# Patient Record
Sex: Female | Born: 1935 | Race: White | Hispanic: No | Marital: Married
Health system: Southern US, Community
[De-identification: ages and names within clinical notes are randomized; demographics above are authoritative.]

---

## 2004-09-10 ENCOUNTER — Ambulatory Visit: Payer: Self-pay | Admitting: Ophthalmology

## 2004-09-14 ENCOUNTER — Ambulatory Visit: Payer: Self-pay | Admitting: Ophthalmology

## 2004-10-06 ENCOUNTER — Ambulatory Visit: Payer: Self-pay | Admitting: Internal Medicine

## 2004-10-12 ENCOUNTER — Ambulatory Visit: Payer: Self-pay | Admitting: Ophthalmology

## 2005-03-23 ENCOUNTER — Ambulatory Visit: Payer: Self-pay | Admitting: Internal Medicine

## 2005-04-05 ENCOUNTER — Ambulatory Visit: Payer: Self-pay | Admitting: Internal Medicine

## 2005-07-21 ENCOUNTER — Ambulatory Visit: Payer: Self-pay | Admitting: Unknown Physician Specialty

## 2005-11-10 ENCOUNTER — Ambulatory Visit: Payer: Self-pay | Admitting: General Surgery

## 2006-04-26 ENCOUNTER — Ambulatory Visit: Payer: Self-pay | Admitting: Internal Medicine

## 2006-12-07 ENCOUNTER — Ambulatory Visit: Payer: Self-pay | Admitting: General Surgery

## 2007-07-04 ENCOUNTER — Ambulatory Visit: Payer: Self-pay | Admitting: Family Medicine

## 2008-01-01 ENCOUNTER — Ambulatory Visit: Payer: Self-pay | Admitting: Internal Medicine

## 2008-04-14 ENCOUNTER — Ambulatory Visit: Payer: Self-pay | Admitting: Internal Medicine

## 2008-10-16 ENCOUNTER — Ambulatory Visit: Payer: Self-pay | Admitting: Family Medicine

## 2008-12-11 ENCOUNTER — Ambulatory Visit: Payer: Self-pay | Admitting: Family Medicine

## 2009-01-08 ENCOUNTER — Ambulatory Visit: Payer: Self-pay | Admitting: Family Medicine

## 2009-06-12 ENCOUNTER — Emergency Department: Payer: Self-pay | Admitting: Emergency Medicine

## 2010-01-12 ENCOUNTER — Ambulatory Visit: Payer: Self-pay | Admitting: Family Medicine

## 2010-12-25 ENCOUNTER — Inpatient Hospital Stay: Payer: Self-pay | Admitting: Internal Medicine

## 2010-12-26 ENCOUNTER — Ambulatory Visit: Payer: Self-pay | Admitting: Internal Medicine

## 2010-12-29 LAB — CANCER ANTIGEN 19-9: CA 19-9: 8615 U/mL — ABNORMAL HIGH (ref 0–35)

## 2011-01-15 ENCOUNTER — Inpatient Hospital Stay: Payer: Self-pay | Admitting: Internal Medicine

## 2011-01-26 ENCOUNTER — Ambulatory Visit: Payer: Self-pay | Admitting: Internal Medicine

## 2011-01-27 LAB — CREATININE, SERUM
EGFR (African American): >60
EGFR (Non-African Amer.): >60

## 2011-02-05 ENCOUNTER — Ambulatory Visit: Payer: Self-pay | Admitting: Internal Medicine

## 2011-02-05 LAB — POTASSIUM: Potassium: 2.7 mmol/L — ABNORMAL LOW (ref 3.5–5.1)

## 2011-02-26 ENCOUNTER — Ambulatory Visit: Payer: Self-pay | Admitting: Internal Medicine

## 2011-05-11 ENCOUNTER — Ambulatory Visit: Payer: Self-pay | Admitting: Internal Medicine

## 2011-05-26 ENCOUNTER — Ambulatory Visit: Payer: Self-pay | Admitting: Internal Medicine

## 2011-08-10 ENCOUNTER — Ambulatory Visit: Payer: Self-pay | Admitting: Internal Medicine

## 2011-08-26 ENCOUNTER — Ambulatory Visit: Payer: Self-pay | Admitting: Internal Medicine

## 2011-09-30 ENCOUNTER — Ambulatory Visit: Payer: Self-pay | Admitting: Gastroenterology

## 2011-11-09 ENCOUNTER — Ambulatory Visit: Payer: Self-pay | Admitting: Internal Medicine

## 2011-11-26 ENCOUNTER — Ambulatory Visit: Payer: Self-pay | Admitting: Internal Medicine

## 2011-12-11 ENCOUNTER — Inpatient Hospital Stay: Payer: Self-pay | Admitting: Internal Medicine

## 2011-12-11 LAB — URINALYSIS, COMPLETE
Bilirubin,UR: NEGATIVE
Hyaline Cast: 93
Ketone: NEGATIVE
Leukocyte Esterase: NEGATIVE
Ph: 5 (ref 4.5–8.0)
Specific Gravity: 1.027 (ref 1.003–1.030)
Squamous Epithelial: 2

## 2011-12-11 LAB — CBC
HCT: 39.3 % (ref 35.0–47.0)
MCH: 29.2 pg (ref 26.0–34.0)
MCHC: 34.3 g/dL (ref 32.0–36.0)
MCV: 85 fL (ref 80–100)
Platelet: 116 10*3/uL — ABNORMAL LOW (ref 150–440)
RDW: 15.2 % — ABNORMAL HIGH (ref 11.5–14.5)
WBC: 12 10*3/uL — ABNORMAL HIGH (ref 3.6–11.0)

## 2011-12-11 LAB — TROPONIN I: Troponin-I: <0.02 ng/mL

## 2011-12-11 LAB — COMPREHENSIVE METABOLIC PANEL
Albumin: 3 g/dL — ABNORMAL LOW (ref 3.4–5.0)
Anion Gap: 10 (ref 7–16)
BUN: 24 mg/dL — ABNORMAL HIGH (ref 7–18)
Calcium, Total: 8.6 mg/dL (ref 8.5–10.1)
Chloride: 104 mmol/L (ref 98–107)
Co2: 24 mmol/L (ref 21–32)
EGFR (African American): 29 — ABNORMAL LOW
EGFR (Non-African Amer.): 25 — ABNORMAL LOW
Glucose: 148 mg/dL — ABNORMAL HIGH (ref 65–99)
Osmolality: 282 (ref 275–301)
Potassium: 3.3 mmol/L — ABNORMAL LOW (ref 3.5–5.1)
SGPT (ALT): 20 U/L (ref 12–78)
Sodium: 138 mmol/L (ref 136–145)
Total Protein: 6.9 g/dL (ref 6.4–8.2)

## 2011-12-11 LAB — AMMONIA: Ammonia, Plasma: <25 mcmol/L (ref 11–32)

## 2011-12-11 LAB — CK TOTAL AND CKMB (NOT AT ARMC): CK, Total: 667 U/L — ABNORMAL HIGH (ref 21–215)

## 2011-12-12 LAB — CBC WITH DIFFERENTIAL/PLATELET
Basophil #: 0 10*3/uL (ref 0.0–0.1)
Basophil %: 0.2 %
HCT: 36 % (ref 35.0–47.0)
HGB: 12.4 g/dL (ref 12.0–16.0)
Lymphocyte %: 4.6 %
MCHC: 34.5 g/dL (ref 32.0–36.0)
MCV: 85 fL (ref 80–100)
Monocyte #: 0.2 x10 3/mm (ref 0.2–0.9)
Monocyte %: 4.3 %
Neutrophil #: 5 10*3/uL (ref 1.4–6.5)
WBC: 5.5 10*3/uL (ref 3.6–11.0)

## 2011-12-12 LAB — BASIC METABOLIC PANEL
Anion Gap: 8 (ref 7–16)
BUN: 21 mg/dL — ABNORMAL HIGH (ref 7–18)
Calcium, Total: 8.5 mg/dL (ref 8.5–10.1)
EGFR (African American): 54 — ABNORMAL LOW
EGFR (Non-African Amer.): 47 — ABNORMAL LOW
Glucose: 113 mg/dL — ABNORMAL HIGH (ref 65–99)
Osmolality: 281 (ref 275–301)
Potassium: 3.7 mmol/L (ref 3.5–5.1)

## 2011-12-12 LAB — MAGNESIUM: Magnesium: 1.6 mg/dL — ABNORMAL LOW

## 2011-12-13 LAB — CBC WITH DIFFERENTIAL/PLATELET
Basophil #: 0 10*3/uL (ref 0.0–0.1)
Basophil %: 0.4 %
Eosinophil #: 0.1 10*3/uL (ref 0.0–0.7)
Eosinophil %: 0.9 %
Eosinophil %: 1.1 %
HCT: 32.4 % — ABNORMAL LOW (ref 35.0–47.0)
HCT: 34.7 % — ABNORMAL LOW (ref 35.0–47.0)
HGB: 11.8 g/dL — ABNORMAL LOW (ref 12.0–16.0)
Lymphocyte #: 0.6 10*3/uL — ABNORMAL LOW (ref 1.0–3.6)
Lymphocyte %: 7.5 %
MCH: 29 pg (ref 26.0–34.0)
Monocyte #: 0.3 x10 3/mm (ref 0.2–0.9)
Monocyte %: 7.4 %
Monocyte %: 5.6 %
Neutrophil #: 4.4 10*3/uL (ref 1.4–6.5)
Neutrophil #: 4.9 10*3/uL (ref 1.4–6.5)
Neutrophil %: 84.1 %
Neutrophil %: 82.9 %
Platelet: 41 10*3/uL — ABNORMAL LOW (ref 150–440)
RDW: 15.2 % — ABNORMAL HIGH (ref 11.5–14.5)
RDW: 15.2 % — ABNORMAL HIGH (ref 11.5–14.5)
WBC: 5.2 10*3/uL (ref 3.6–11.0)
WBC: 6 10*3/uL (ref 3.6–11.0)

## 2011-12-13 LAB — PROTIME-INR
INR: 1.1
Prothrombin Time: 14.2 secs (ref 11.5–14.7)

## 2011-12-13 LAB — COMPREHENSIVE METABOLIC PANEL
Albumin: 1.9 g/dL — ABNORMAL LOW (ref 3.4–5.0)
Anion Gap: 7 (ref 7–16)
BUN: 19 mg/dL — ABNORMAL HIGH (ref 7–18)
Calcium, Total: 8 mg/dL — ABNORMAL LOW (ref 8.5–10.1)
Chloride: 109 mmol/L — ABNORMAL HIGH (ref 98–107)
Co2: 21 mmol/L (ref 21–32)
Creatinine: 1.06 mg/dL (ref 0.60–1.30)
EGFR (Non-African Amer.): 50 — ABNORMAL LOW
Osmolality: 276 (ref 275–301)
SGOT(AST): 46 U/L — ABNORMAL HIGH (ref 15–37)
SGPT (ALT): 17 U/L (ref 12–78)
Total Protein: 5.3 g/dL — ABNORMAL LOW (ref 6.4–8.2)

## 2011-12-13 LAB — FIBRIN DEGRADATION PROD.(ARMC ONLY): Fibrin Degradation Prod.: >10 ug/ml — ABNORMAL HIGH (ref 2.1–7.7)

## 2011-12-13 LAB — APTT: Activated PTT: 33.9 secs (ref 23.6–35.9)

## 2011-12-13 LAB — FIBRINOGEN: Fibrinogen: >750 mg/dL — ABNORMAL HIGH (ref 210–470)

## 2011-12-14 LAB — CBC WITH DIFFERENTIAL/PLATELET
Basophil %: 0.1 %
Eosinophil #: 0.1 10*3/uL (ref 0.0–0.7)
HCT: 32.9 % — ABNORMAL LOW (ref 35.0–47.0)
HGB: 11.4 g/dL — ABNORMAL LOW (ref 12.0–16.0)
Lymphocyte #: 0.5 10*3/uL — ABNORMAL LOW (ref 1.0–3.6)
Lymphocyte %: 7.6 %
MCH: 29.1 pg (ref 26.0–34.0)
MCHC: 34.6 g/dL (ref 32.0–36.0)
Monocyte #: 0.5 x10 3/mm (ref 0.2–0.9)
Neutrophil %: 83.7 %
Platelet: 62 10*3/uL — ABNORMAL LOW (ref 150–440)
RDW: 15.1 % — ABNORMAL HIGH (ref 11.5–14.5)

## 2011-12-14 LAB — CULTURE, BLOOD (SINGLE)

## 2011-12-15 LAB — HEPATIC FUNCTION PANEL A (ARMC)
Albumin: 2 g/dL — ABNORMAL LOW (ref 3.4–5.0)
Alkaline Phosphatase: 80 U/L (ref 50–136)
Bilirubin, Direct: 0.2 mg/dL (ref 0.00–0.20)
Bilirubin,Total: 0.7 mg/dL (ref 0.2–1.0)
SGOT(AST): 36 U/L (ref 15–37)
Total Protein: 4.9 g/dL — ABNORMAL LOW (ref 6.4–8.2)

## 2011-12-15 LAB — CBC WITH DIFFERENTIAL/PLATELET
Basophil #: 0 10*3/uL (ref 0.0–0.1)
Eosinophil #: 0.1 10*3/uL (ref 0.0–0.7)
Lymphocyte #: 0.9 10*3/uL — ABNORMAL LOW (ref 1.0–3.6)
MCV: 85 fL (ref 80–100)
Monocyte #: 0.5 x10 3/mm (ref 0.2–0.9)
Monocyte %: 10.2 %
Neutrophil #: 3 10*3/uL (ref 1.4–6.5)
Platelet: 73 10*3/uL — ABNORMAL LOW (ref 150–440)
RDW: 15.4 % — ABNORMAL HIGH (ref 11.5–14.5)
WBC: 4.4 10*3/uL (ref 3.6–11.0)

## 2012-02-02 ENCOUNTER — Ambulatory Visit: Payer: Self-pay | Admitting: Gastroenterology

## 2012-05-05 ENCOUNTER — Emergency Department: Payer: Self-pay | Admitting: Emergency Medicine

## 2012-06-18 IMAGING — US ABDOMEN ULTRASOUND
1 series · 17 of 25 positions shown · non-contrast
Comparison: none

REASON FOR EXAM: abd pain
COMMENTS:

[Series 1: abdomen ultrasound · 17 of 91 slices shown]
[im 1/91]
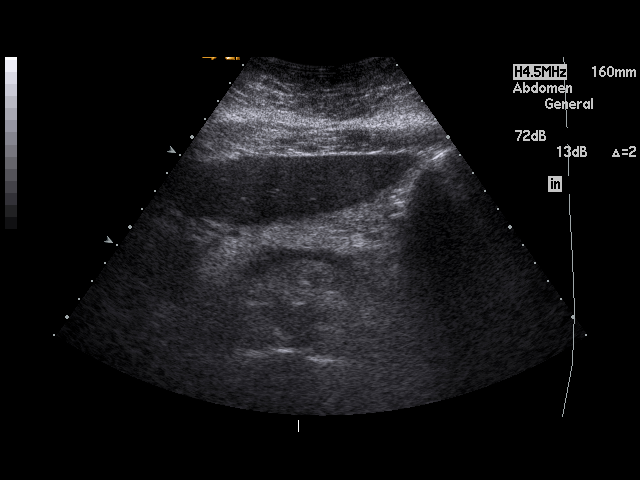
[im 8/91]
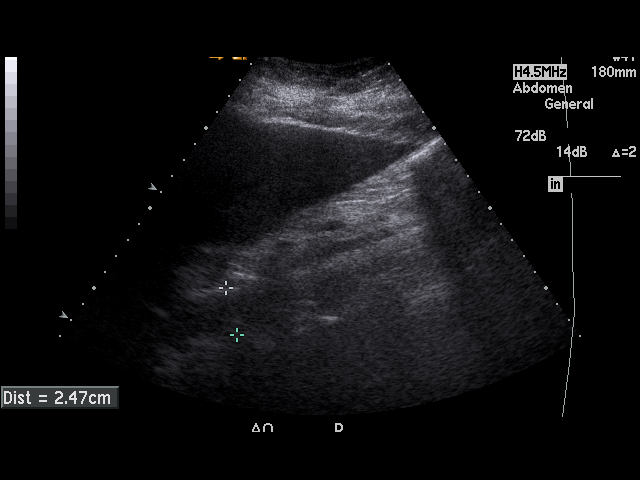
[im 12/91]
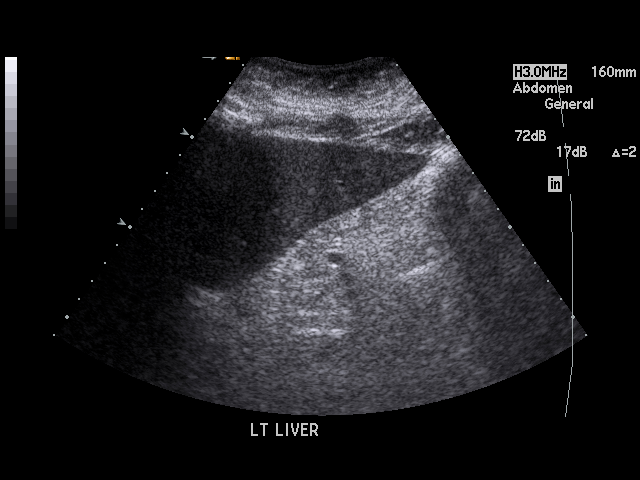
[im 19/91]
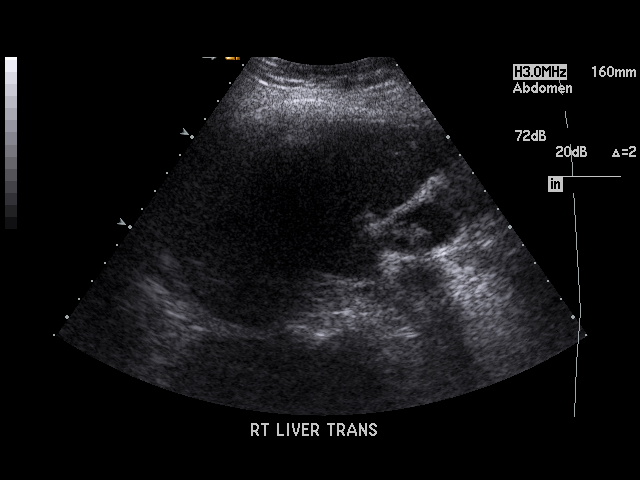
[im 23/91]
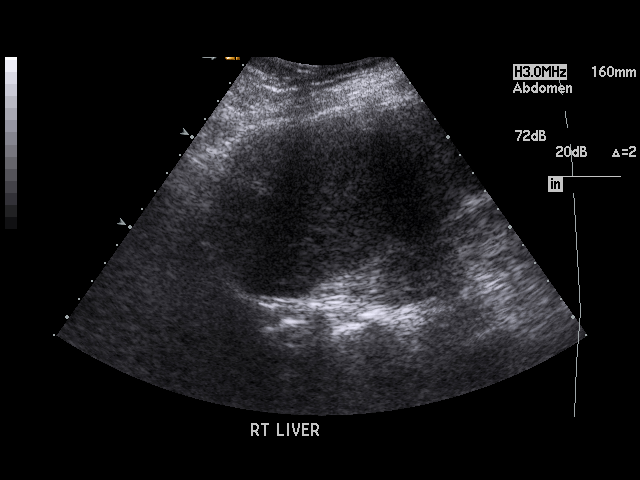
[im 31/91]
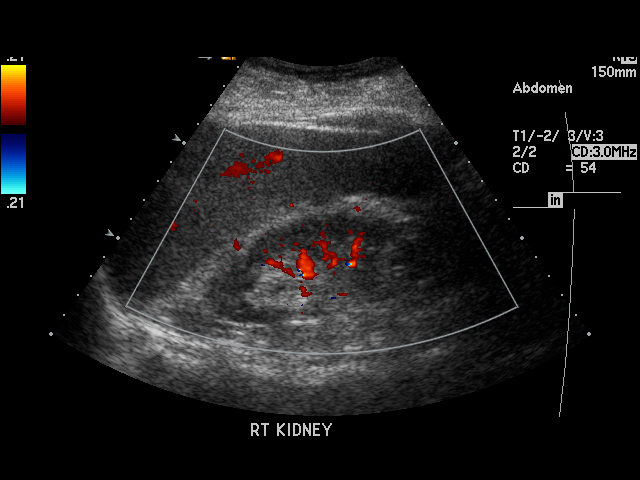
[im 34/91]
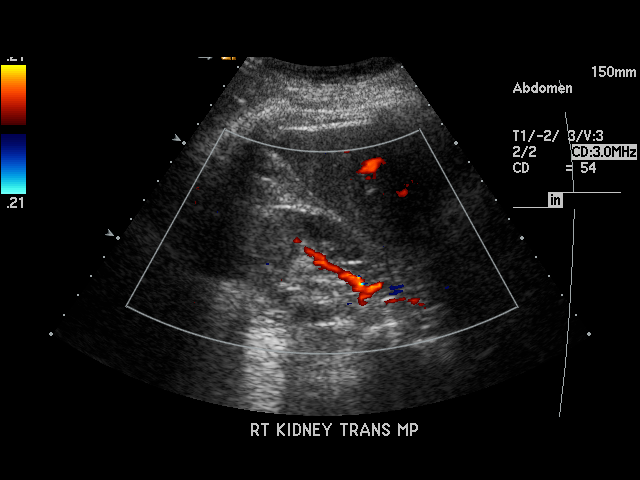
[im 42/91]
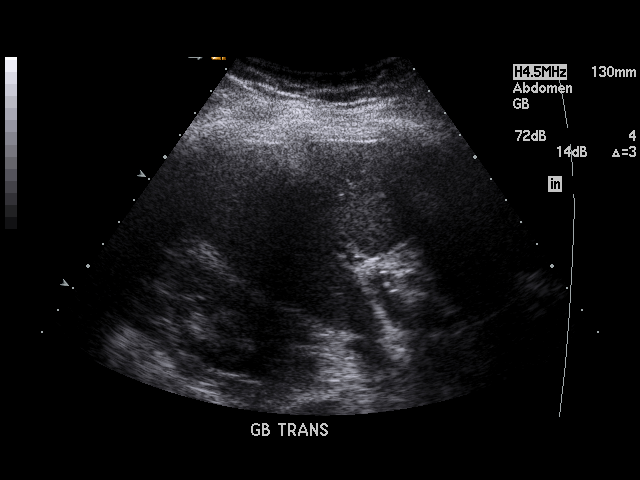
[im 46/91]
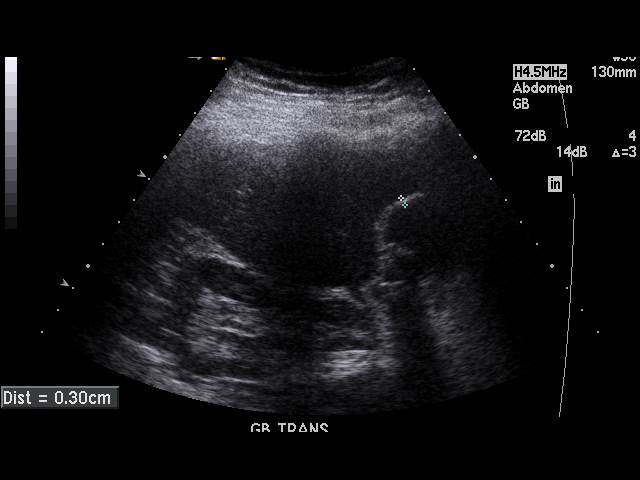
[im 49/91]
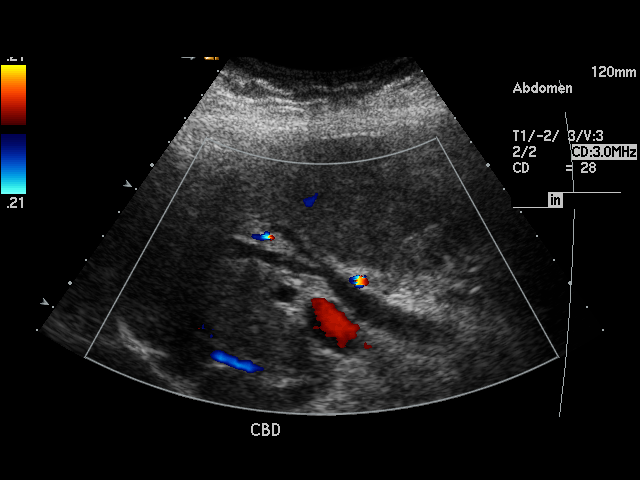
[im 57/91]
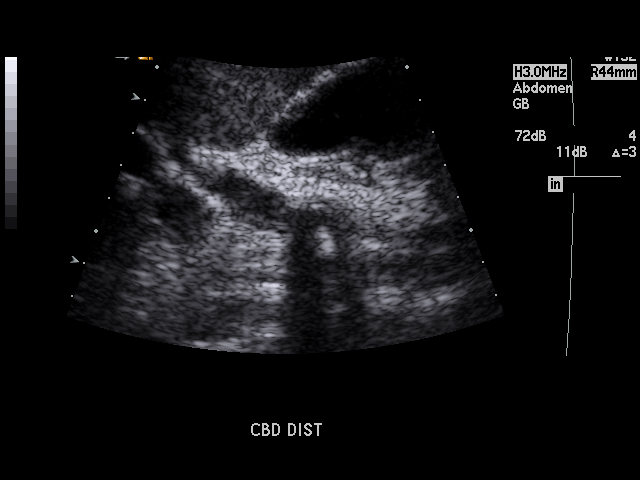
[im 61/91]
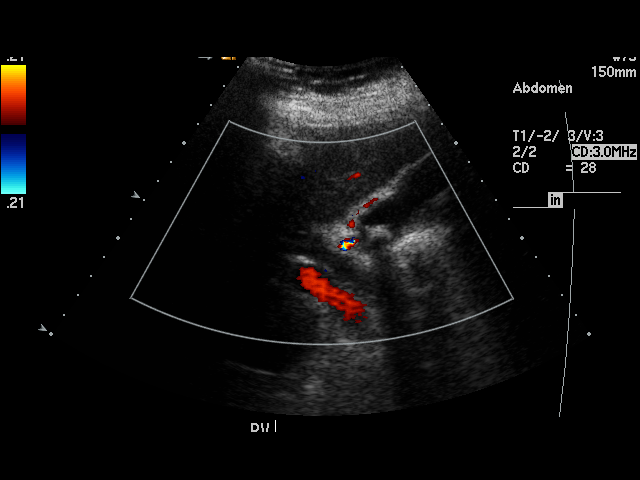
[im 68/91]
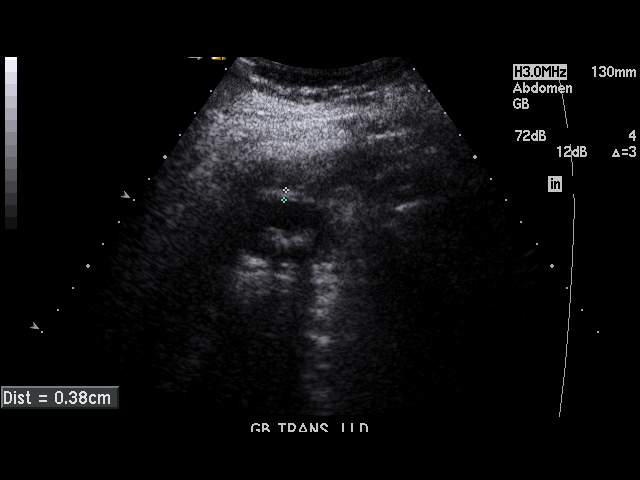
[im 72/91]
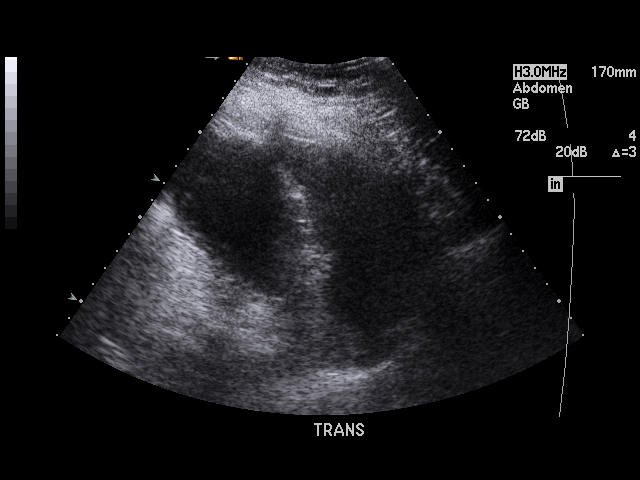
[im 79/91]
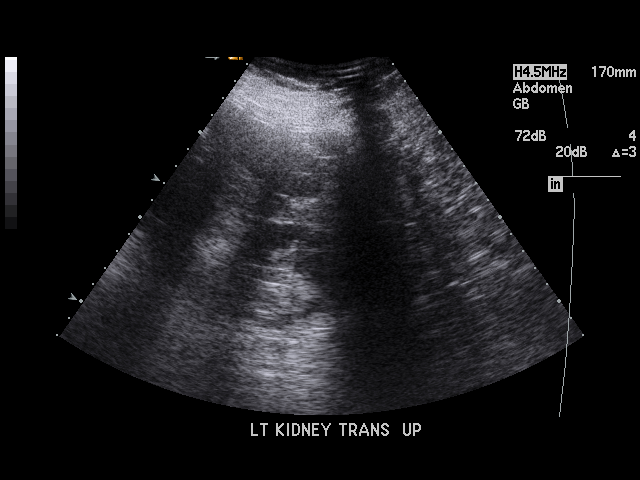
[im 83/91]
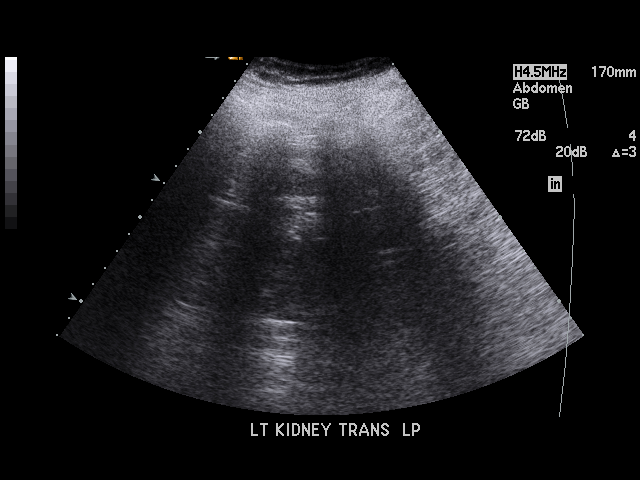
[im 91/91]
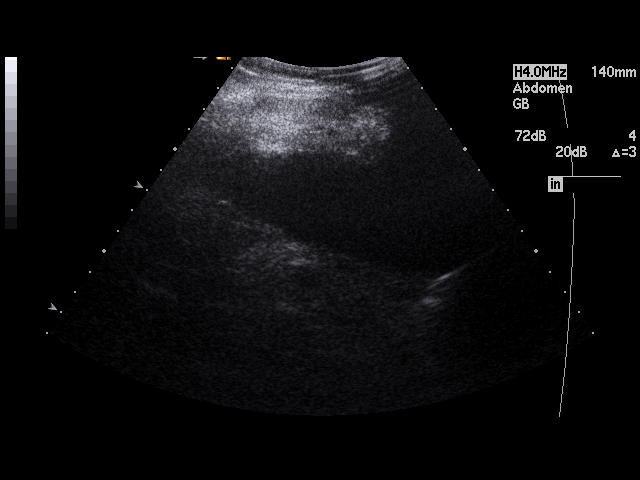

[17 of 25 positions shown; findings below may reference images not displayed]

PROCEDURE:     US  - US ABDOMEN GENERAL SURVEY  - January 15, 2011  [DATE]

RESULT:     The liver, pancreas, abdominal aorta and inferior vena cava show
no significant abnormalities. The spleen measures 13.66 cm at maximum
diameter and is mildly enlarged. There are dilated intrahepatic biliary
ducts. The common bile duct is enlarged and measures 9.8 mm at maximum
diameter. There is observed a shadowing echodensity in the common bile duct
compatible with choledocholithiasis. There are again noted multiple echo
densities in the gallbladder compatible with gallstones. There is thickening
of the gallbladder wall which now measures approximately 4.2 mm at maximum
thickness. No pericholecystic fluid is seen.

The kidneys show no hydronephrosis. No ascites is seen.
IMPRESSION: 1. Cholelithiasis with there being associated thickening of the gallbladder
wall suspicious for cholecystitis.
2. There is dilatation of the common bile duct with dilated intrahepatic
ducts observed.
3. There is a shadowing echodensity visualized within the common duct
consistent with choledocholithiasis.
4. The spleen is mildly enlarged.

## 2012-07-25 ENCOUNTER — Ambulatory Visit: Payer: Self-pay | Admitting: Internal Medicine

## 2012-08-08 ENCOUNTER — Inpatient Hospital Stay: Payer: Self-pay | Admitting: Internal Medicine

## 2012-08-08 LAB — COMPREHENSIVE METABOLIC PANEL
Albumin: 2.9 g/dL — ABNORMAL LOW (ref 3.4–5.0)
BUN: 71 mg/dL — ABNORMAL HIGH (ref 7–18)
Bilirubin,Total: 0.3 mg/dL (ref 0.2–1.0)
Chloride: 103 mmol/L (ref 98–107)
Creatinine: 5.76 mg/dL — ABNORMAL HIGH (ref 0.60–1.30)
EGFR (African American): 7 — ABNORMAL LOW
Glucose: 83 mg/dL (ref 65–99)
Osmolality: 288 (ref 275–301)
SGOT(AST): 17 U/L (ref 15–37)
Sodium: 134 mmol/L — ABNORMAL LOW (ref 136–145)

## 2012-08-08 LAB — CBC
HCT: 30.9 % — ABNORMAL LOW (ref 35.0–47.0)
MCH: 28.4 pg (ref 26.0–34.0)
MCHC: 33.9 g/dL (ref 32.0–36.0)
MCV: 84 fL (ref 80–100)
WBC: 7.4 10*3/uL (ref 3.6–11.0)

## 2012-08-08 LAB — URINALYSIS, COMPLETE
Bilirubin,UR: NEGATIVE
Glucose,UR: NEGATIVE mg/dL (ref 0–75)
Ketone: NEGATIVE
Nitrite: POSITIVE
Ph: 6 (ref 4.5–8.0)
RBC,UR: 5 /HPF (ref 0–5)
Squamous Epithelial: 2
WBC UR: 587 /HPF (ref 0–5)

## 2012-08-08 LAB — TROPONIN I: Troponin-I: <0.02 ng/mL

## 2012-08-08 LAB — VALPROIC ACID LEVEL: Valproic Acid: 45 ug/mL — ABNORMAL LOW

## 2012-08-09 LAB — CBC WITH DIFFERENTIAL/PLATELET
Basophil %: 0.7 %
Eosinophil #: 0.2 10*3/uL (ref 0.0–0.7)
Eosinophil %: 4.1 %
HCT: 27 % — ABNORMAL LOW (ref 35.0–47.0)
HGB: 9.5 g/dL — ABNORMAL LOW (ref 12.0–16.0)
Lymphocyte #: 0.6 10*3/uL — ABNORMAL LOW (ref 1.0–3.6)
MCH: 29.2 pg (ref 26.0–34.0)
MCV: 83 fL (ref 80–100)
Monocyte %: 9.5 %
Neutrophil %: 75.2 %
Platelet: 133 10*3/uL — ABNORMAL LOW (ref 150–440)
RBC: 3.25 10*6/uL — ABNORMAL LOW (ref 3.80–5.20)
RDW: 14 % (ref 11.5–14.5)

## 2012-08-09 LAB — TSH: Thyroid Stimulating Horm: 2.69 u[IU]/mL

## 2012-08-09 LAB — BASIC METABOLIC PANEL
BUN: 61 mg/dL — ABNORMAL HIGH (ref 7–18)
Calcium, Total: 8.2 mg/dL — ABNORMAL LOW (ref 8.5–10.1)
Chloride: 111 mmol/L — ABNORMAL HIGH (ref 98–107)
Co2: 20 mmol/L — ABNORMAL LOW (ref 21–32)
Creatinine: 5.06 mg/dL — ABNORMAL HIGH (ref 0.60–1.30)
EGFR (African American): 9 — ABNORMAL LOW
Glucose: 77 mg/dL (ref 65–99)
Potassium: 2.9 mmol/L — ABNORMAL LOW (ref 3.5–5.1)

## 2012-08-10 LAB — BASIC METABOLIC PANEL
Anion Gap: 12 (ref 7–16)
BUN: 43 mg/dL — ABNORMAL HIGH (ref 7–18)
Co2: 16 mmol/L — ABNORMAL LOW (ref 21–32)
Creatinine: 3.84 mg/dL — ABNORMAL HIGH (ref 0.60–1.30)
EGFR (Non-African Amer.): 10 — ABNORMAL LOW
Osmolality: 298 (ref 275–301)
Potassium: 3.3 mmol/L — ABNORMAL LOW (ref 3.5–5.1)
Sodium: 145 mmol/L (ref 136–145)

## 2012-08-11 LAB — BASIC METABOLIC PANEL
Anion Gap: 10 (ref 7–16)
Calcium, Total: 8.1 mg/dL — ABNORMAL LOW (ref 8.5–10.1)
Co2: 16 mmol/L — ABNORMAL LOW (ref 21–32)
Creatinine: 3.42 mg/dL — ABNORMAL HIGH (ref 0.60–1.30)
Glucose: 81 mg/dL (ref 65–99)
Osmolality: 296 (ref 275–301)
Sodium: 146 mmol/L — ABNORMAL HIGH (ref 136–145)

## 2012-08-11 LAB — URINE CULTURE

## 2012-08-11 LAB — MAGNESIUM: Magnesium: 1.2 mg/dL — ABNORMAL LOW

## 2012-08-12 LAB — BASIC METABOLIC PANEL
BUN: 21 mg/dL — ABNORMAL HIGH (ref 7–18)
Calcium, Total: 8.1 mg/dL — ABNORMAL LOW (ref 8.5–10.1)
Co2: 17 mmol/L — ABNORMAL LOW (ref 21–32)
Creatinine: 2.76 mg/dL — ABNORMAL HIGH (ref 0.60–1.30)
EGFR (African American): 18 — ABNORMAL LOW
EGFR (Non-African Amer.): 16 — ABNORMAL LOW
Glucose: 85 mg/dL (ref 65–99)
Osmolality: 289 (ref 275–301)
Sodium: 144 mmol/L (ref 136–145)

## 2012-08-13 LAB — BASIC METABOLIC PANEL
Anion Gap: 6 — ABNORMAL LOW (ref 7–16)
BUN: 21 mg/dL — ABNORMAL HIGH (ref 7–18)
Calcium, Total: 8.2 mg/dL — ABNORMAL LOW (ref 8.5–10.1)
EGFR (African American): 19 — ABNORMAL LOW
EGFR (Non-African Amer.): 16 — ABNORMAL LOW
Glucose: 83 mg/dL (ref 65–99)

## 2012-08-14 LAB — PROT IMMUNOELECTROPHORES(ARMC)

## 2012-08-14 LAB — BASIC METABOLIC PANEL
Anion Gap: 6 — ABNORMAL LOW (ref 7–16)
Calcium, Total: 8.4 mg/dL — ABNORMAL LOW (ref 8.5–10.1)
Chloride: 115 mmol/L — ABNORMAL HIGH (ref 98–107)
Co2: 21 mmol/L (ref 21–32)
Creatinine: 2.48 mg/dL — ABNORMAL HIGH (ref 0.60–1.30)
EGFR (African American): 21 — ABNORMAL LOW
Sodium: 142 mmol/L (ref 136–145)

## 2012-08-15 LAB — BASIC METABOLIC PANEL
Anion Gap: 6 — ABNORMAL LOW (ref 7–16)
BUN: 22 mg/dL — ABNORMAL HIGH (ref 7–18)
Calcium, Total: 8.8 mg/dL (ref 8.5–10.1)
Chloride: 114 mmol/L — ABNORMAL HIGH (ref 98–107)
Creatinine: 2.61 mg/dL — ABNORMAL HIGH (ref 0.60–1.30)
EGFR (Non-African Amer.): 17 — ABNORMAL LOW
Glucose: 89 mg/dL (ref 65–99)
Osmolality: 286 (ref 275–301)
Sodium: 142 mmol/L (ref 136–145)

## 2012-08-16 LAB — BASIC METABOLIC PANEL
Anion Gap: 5 — ABNORMAL LOW (ref 7–16)
Calcium, Total: 8.6 mg/dL (ref 8.5–10.1)
Chloride: 115 mmol/L — ABNORMAL HIGH (ref 98–107)
Co2: 22 mmol/L (ref 21–32)
EGFR (Non-African Amer.): 15 — ABNORMAL LOW
Osmolality: 287 (ref 275–301)
Potassium: 3.6 mmol/L (ref 3.5–5.1)
Sodium: 142 mmol/L (ref 136–145)

## 2012-08-17 LAB — BASIC METABOLIC PANEL
Anion Gap: 5 — ABNORMAL LOW (ref 7–16)
Calcium, Total: 8.5 mg/dL (ref 8.5–10.1)
Chloride: 116 mmol/L — ABNORMAL HIGH (ref 98–107)
Co2: 22 mmol/L (ref 21–32)
Osmolality: 288 (ref 275–301)
Sodium: 143 mmol/L (ref 136–145)

## 2012-08-17 LAB — PLATELET COUNT: Platelet: 116 10*3/uL — ABNORMAL LOW (ref 150–440)

## 2012-08-25 ENCOUNTER — Ambulatory Visit: Payer: Self-pay | Admitting: Internal Medicine

## 2012-12-27 ENCOUNTER — Emergency Department: Payer: Self-pay

## 2012-12-27 LAB — BASIC METABOLIC PANEL
Anion Gap: 9 (ref 7–16)
BUN: 16 mg/dL (ref 7–18)
Calcium, Total: 9.3 mg/dL (ref 8.5–10.1)
Creatinine: 1.48 mg/dL — ABNORMAL HIGH (ref 0.60–1.30)
EGFR (Non-African Amer.): 33 — ABNORMAL LOW
Osmolality: 278 (ref 275–301)
Sodium: 139 mmol/L (ref 136–145)

## 2012-12-27 LAB — URINALYSIS, COMPLETE
Bilirubin,UR: NEGATIVE
Ph: 5 (ref 4.5–8.0)
Protein: 100
Specific Gravity: 1.016 (ref 1.003–1.030)

## 2012-12-27 LAB — CBC
HGB: 12.7 g/dL (ref 12.0–16.0)
MCH: 29.6 pg (ref 26.0–34.0)
MCHC: 33.5 g/dL (ref 32.0–36.0)
MCV: 88 fL (ref 80–100)
Platelet: 155 10*3/uL (ref 150–440)
RBC: 4.28 10*6/uL (ref 3.80–5.20)

## 2012-12-30 LAB — URINE CULTURE

## 2013-03-11 ENCOUNTER — Emergency Department: Payer: Self-pay | Admitting: Emergency Medicine

## 2013-03-27 ENCOUNTER — Emergency Department: Payer: Self-pay | Admitting: Emergency Medicine

## 2014-01-03 ENCOUNTER — Emergency Department: Payer: Self-pay | Admitting: Emergency Medicine

## 2014-01-10 IMAGING — CT CT HEAD WITHOUT CONTRAST
2 series · 15 of 30 positions shown, 19 images · non-contrast
Comparison: none

REASON FOR EXAM: generalized weakness
COMMENTS:   May transport without cardiac monitor

[Series 2: without · axial · non-contrast · 0.40mm/px · z∈[-74,+46]mm · 13 of 30 slices shown, 17 images]
[im 3/30  brain]
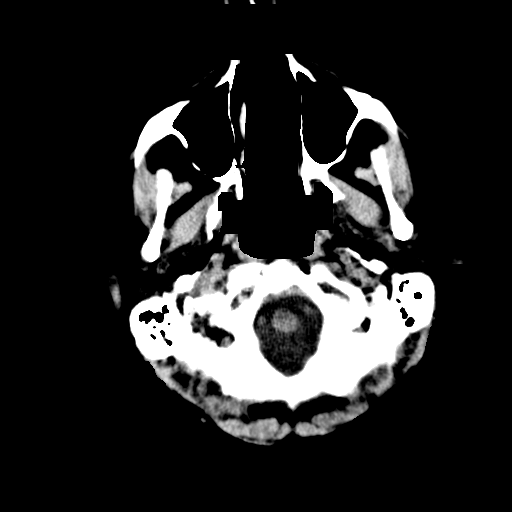
[im 3/30  bone]
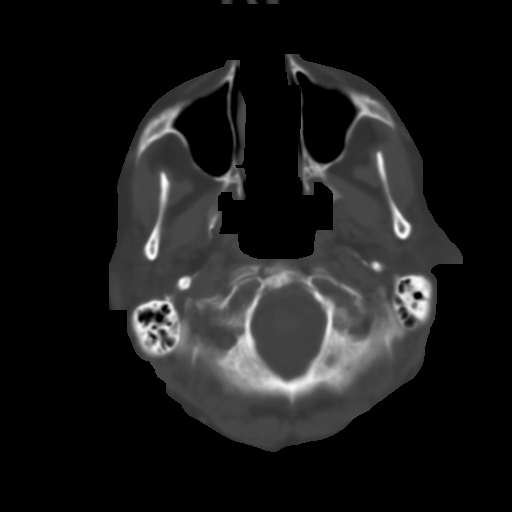
[im 5/30  brain]
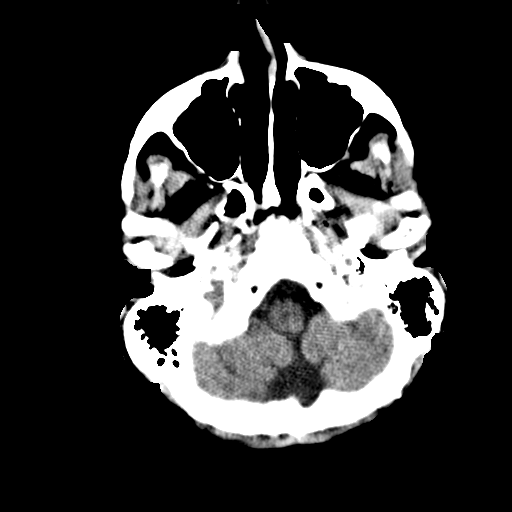
[im 7/30  brain]
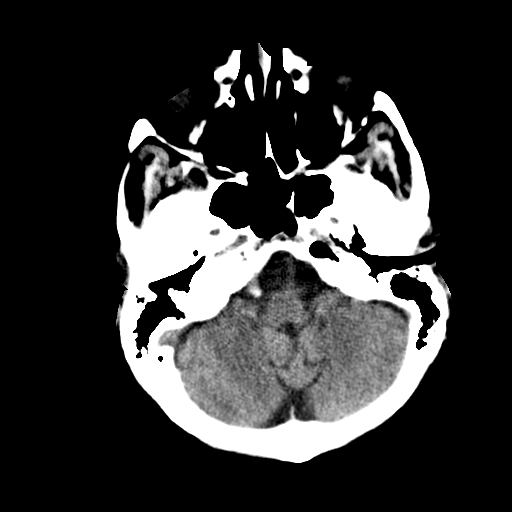
[im 9/30  brain]
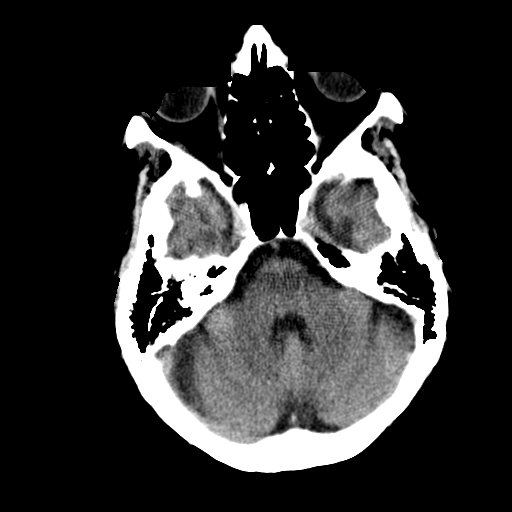
[im 11/30  brain]
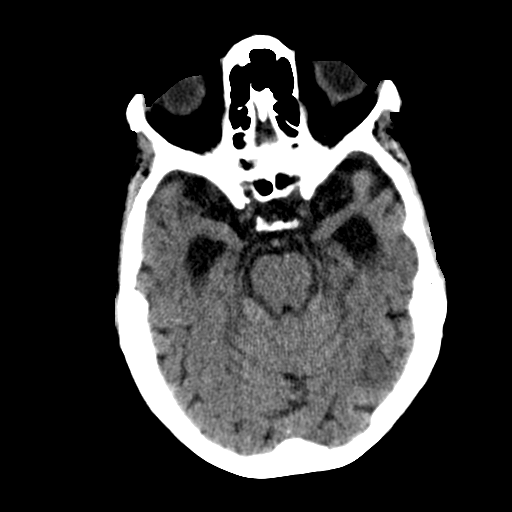
[im 11/30  bone]
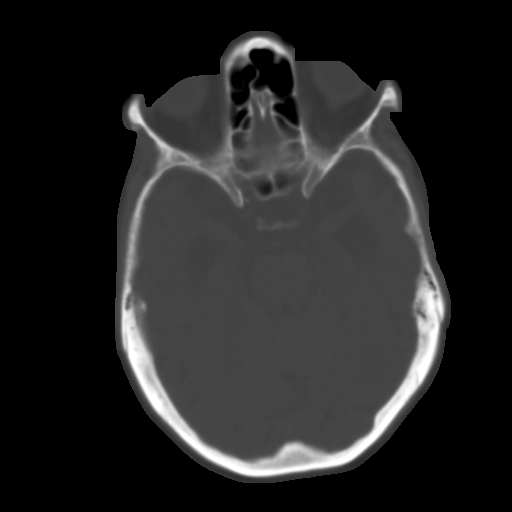
[im 13/30  brain]
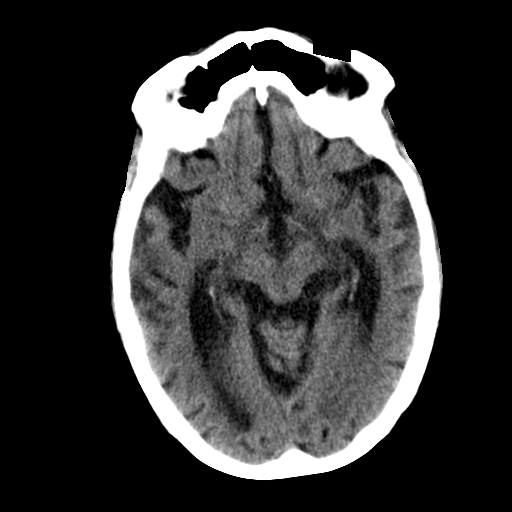
[im 15/30  brain]
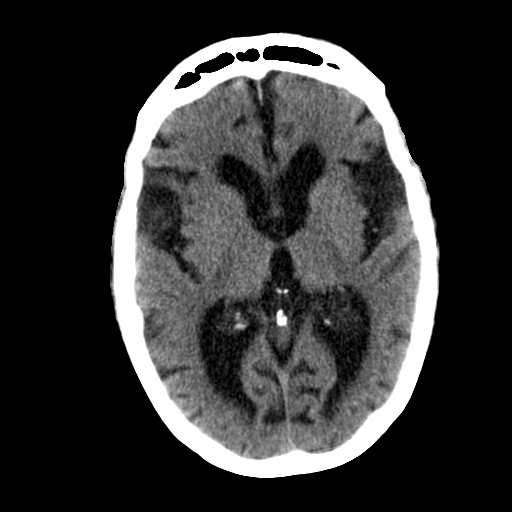
[im 17/30  brain]
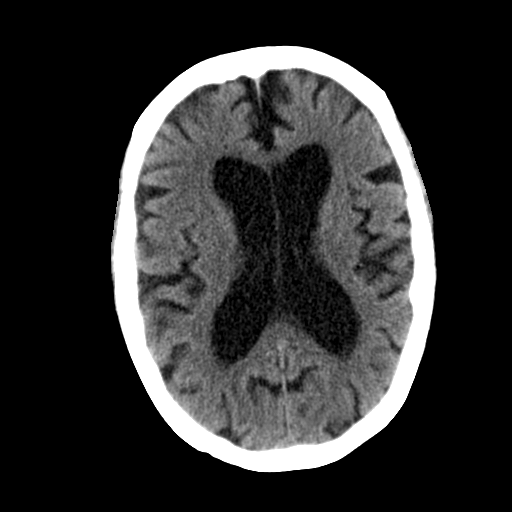
[im 19/30  brain]
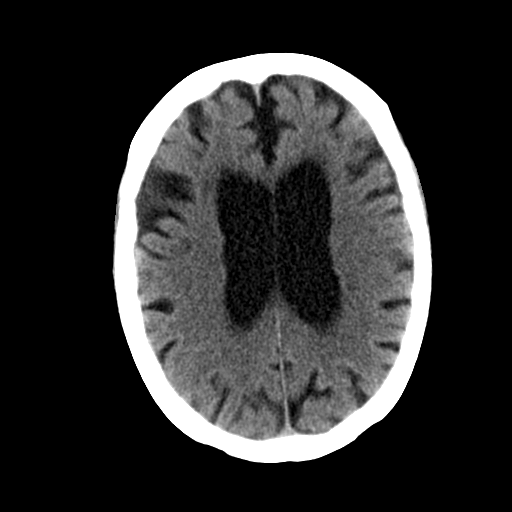
[im 19/30  bone]
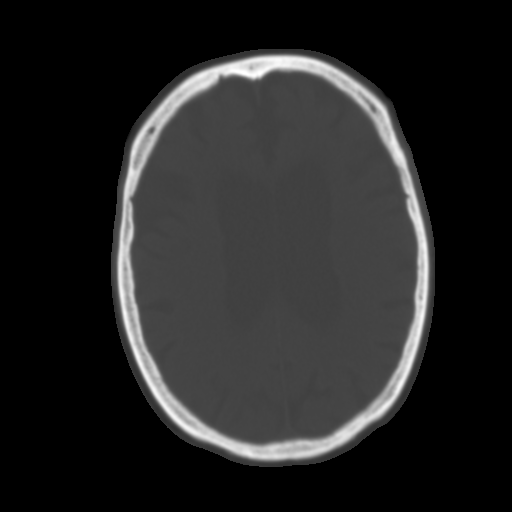
[im 21/30  brain]
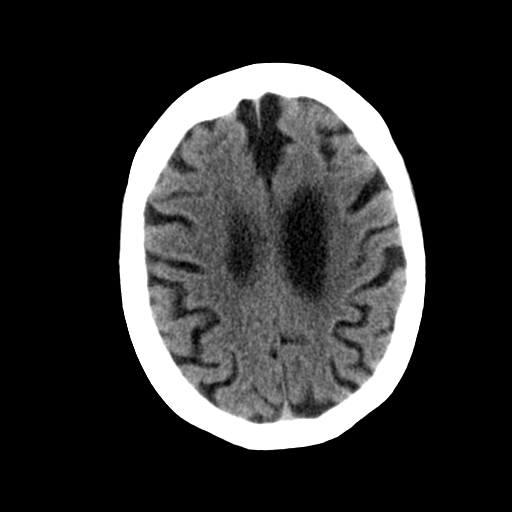
[im 23/30  brain]
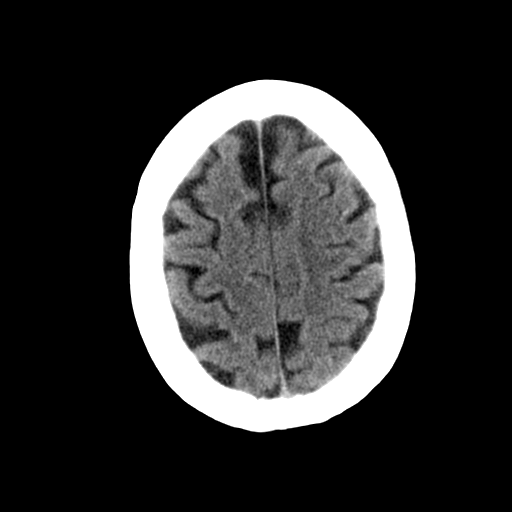
[im 25/30  brain]
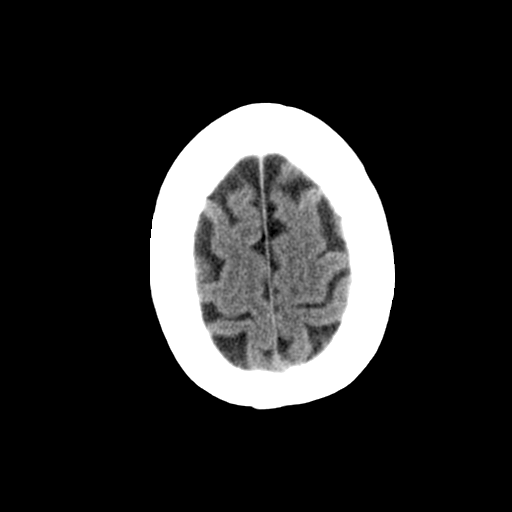
[im 27/30  brain]
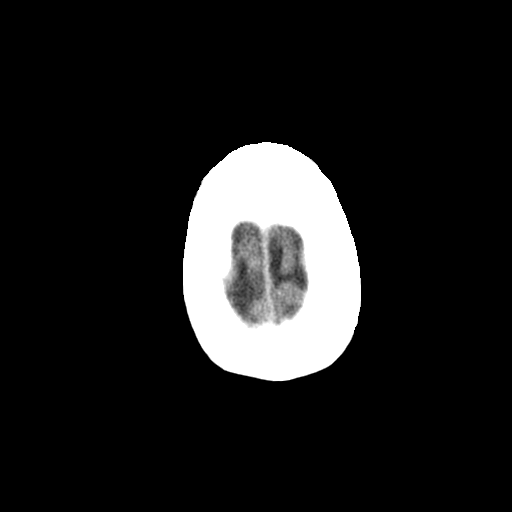
[im 27/30  bone]
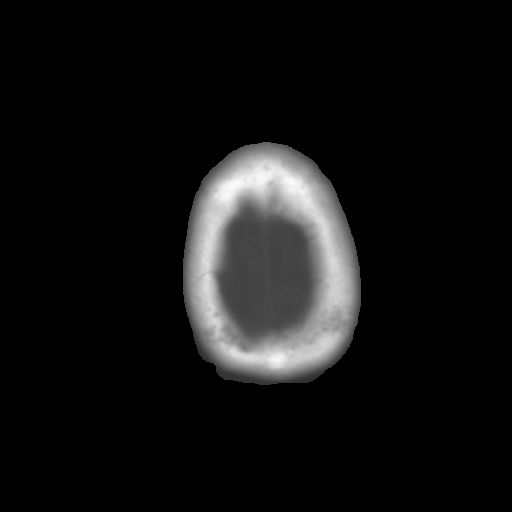

[Series 3: bone · axial · 0.40mm/px · z∈[-74,-54]mm · 2 of 30 slices shown]
[im 3/30  bone]
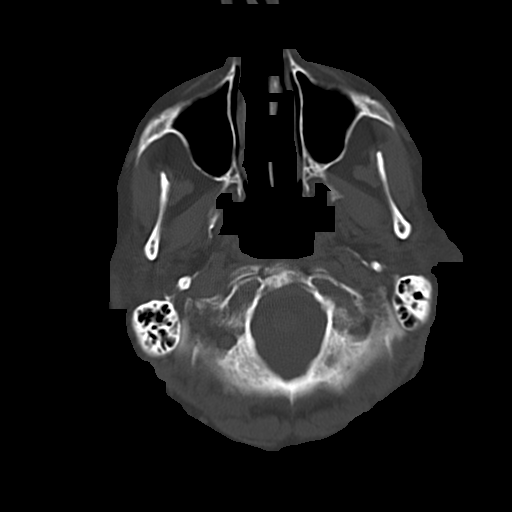
[im 7/30  bone]
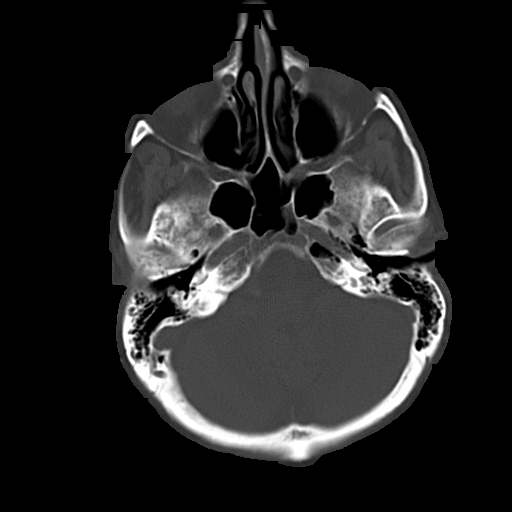

[15 of 30 positions shown; findings below may reference images not displayed]

PROCEDURE:     CT  - CT HEAD WITHOUT CONTRAST  - August 08, 2012  [DATE]

RESULT:     Axial noncontrast CT scanning was performed through the brain
with reconstructions at 5 mm intervals and slice thicknesses. Comparison is
made to the study December 11, 2011.

There is moderate diffuse cerebral and cerebellar atrophy with compensatory
ventriculomegaly. This is not new but is more conspicuous than on the
previous study. There is no shift of the midline. There is no intracranial
hemorrhage. There is no evidence of an evolving ischemic infarction. The
cerebellum and brainstem exhibit no acute abnormalities.

At bone window settings the observed portions of the paranasal sinuses and
mastoid air cells are clear.
IMPRESSION: There is no acute intracranial abnormality. There are
significant chronic changes present as described.

[REDACTED]

## 2014-05-14 NOTE — Consult Note (Signed)
PATIENT NAME:  Terri Becker, Terri Becker MR#:  [id number] DATE OF BIRTH:  October 16, 1930  DATE OF CONSULTATION:  12/15/2011  REFERRING PHYSICIAN:  Alford Highland, MD  CONSULTING PHYSICIAN:  Lurline Del, MD  REASON FOR CONSULTATION: Gram-negative sepsis, history of biliary stricture.   HISTORY OF PRESENT ILLNESS: The patient is a 81 year old female with history of dementia. The patient has a rather interesting history. She was initially seen several months ago due to cholangitis and jaundice. MRCP showed a questionable filling defect in the distal common bile duct. ERCP was performed and sludge was removed but no definite stone was seen. A distal biliary stricture was suspected and patient underwent stent placement. EUS was recommended but that was refused by the patient and her husband. A CA-19-9 came back as 8,000 raising serious concerns about possible cholangiocarcinoma. The patient was seen by Oncology and the patient decided not to proceed any further. She has done well since then. Stent has not been exchanged even though it has been almost a year on the patient's request. She seems to be doing fairly well and due to her advanced age there are risks associated with repeated ERCPs. The patient was admitted by Dr. Renae Gloss three days ago with change in mental status. She had a very poor appetite. No jaundice was reported. No abdominal pain. The patient was found to have Escherichia coli bacteremia. Urinalysis was unremarkable. The patient was evaluated on the request of Dr. Renae Gloss. Denies any abdominal pain. Denies nausea or vomiting although she is demented and history is unreliable.   PAST MEDICAL HISTORY: As above and is also significant for:  1. Dementia.  2. Hypothyroidism.  3. Hypertension. 4. Gastroesophageal reflux disease.   5. Chronic thrombocytopenia.   ALLERGIES: Codeine.   MEDICATIONS:  1. Metoprolol. 2. Prilosec. 3. Remeron. 4. Synthroid.   SOCIAL HISTORY: She used to be a  smoker.   FAMILY HISTORY: Unremarkable.   REVIEW OF SYSTEMS: Negative except for what is mentioned in the history of present illness. Denies any change in color of urine or stool. Denies any abdominal pain, nausea, or vomiting.   PHYSICAL EXAMINATION:   GENERAL: Somewhat lethargic appearing female. She does not appear to be jaundiced. She does not appear to be anemic.   NECK: Neck veins are flat.   LUNGS: Grossly clear to auscultation bilaterally with fair air entry.   CARDIOVASCULAR: Regular rate and rhythm. No gallops or murmur.   ABDOMEN: Did not reveal any significant abdominal tenderness. There is no rebound or guarding. There is no hepatosplenomegaly.   NEUROLOGIC: Except for her being quite lethargic is unremarkable.   LABORATORY, DIAGNOSTIC, AND RADIOLOGICAL DATA: White cell count was 12,000 on admission, came down to 5000 the next day. Hemoglobin is fine. Platelet count chronically low at 66. Liver enzymes were unremarkable and repeat liver enzymes as of today are normal as well. Blood cultures positive for Escherichia coli. Urine culture negative.   CT scan of head without contrast was unremarkable.   ASSESSMENT AND PLAN: The patient is with gram-negative bacteremia. Her white cell count was elevated on admission, although it came down to normal within 24 hours. She did have some mental status changes as well as generalized weakness and lethargy. She was febrile and continued to spike a fever of up to 101.9. Biliary source of sepsis was entertained and possibility of biliary source of sepsis was entertained although patient's liver enzymes remain absolutely normal including bilirubin. The patient has responded very well to intravenous antibiotics. She has been  afebrile. Her white cell count is normal and Dr. Renae GlossWieting has requested discharge on p.o. antibiotics with an outpatient GI follow-up with which I agree. On her follow-up visit we will discuss with her and her husband the need  for further evaluation of her biliary tree as a repeat CA-19-9 is normal on two occasions and no definite mass was noted on CT scan of the abdomen earlier. I agree with discharge on p.o. antibiotics and we will follow her as outpatient and set up an appointment for her to have a repeat ERCP with further evaluation of bile duct.   ____________________________ Lurline DelShaukat Melessia Kaus, MD si:drc D: 12/15/2011 15:56:21 ET T: 12/15/2011 16:21:18 ET JOB#: [id number]  cc: Lurline DelShaukat Aldora Perman, MD, <Dictator> Lurline DelSHAUKAT Alyria Krack MD ELECTRONICALLY SIGNED 12/24/2011 20:03

## 2014-05-14 NOTE — Consult Note (Signed)
Brief Consult Note: Diagnosis: Gram negative sepsis.   Patient was seen by consultant.   Discussed with Attending MD.   Comments: Gram negative bacteremia and sepsis of ? source. Urine cultures negative. Bile duct could potentially be a source due to indwelling stent although patient has no jaundice, abdominal pain or liver enzyme abnormalities. ? History of cholangiovarcinoma based on abnormal ERCP and a CA 19-9 of 8000 although repeat CA19-9 x 2 recently are normal.  Recommendations: IV antibiotics. Further recommendations to follow depending on her hospital course over next several hours. If biliary source of sepsis remains a concern will consider ERCP.  Electronic Signatures: Lurline DelIftikhar, Romana Deaton (MD)  (Signed [id number]-Nov-13 18:15)  Authored: Brief Consult Note   Last Updated: 18-Nov-13 18:15 by Lurline DelIftikhar, Mcclain Shall (MD)

## 2014-05-14 NOTE — H&P (Signed)
PATIENT NAME:  Terri Becker, Terri Becker MR#:  [id number] DATE OF BIRTH:  15-Feb-1930  DATE OF ADMISSION:  12/11/2011  PRIMARY CARE PHYSICIAN:  Dr. Quillian QuinceBliss  CHIEF COMPLAINT: Family with hard time getting her out of bed today.   HISTORY OF PRESENT ILLNESS: This is a 81 year old female with history of dementia. She is not the best historian. History is obtained from husband and family at the bedside. Today they had a hard time getting her out of bed. When the husband went to get something to eat she went to the bathroom and then they could not get her up off the toilet. She did have a bowel movement at that time and urination. No blood. Husband noticed that her speech was slurred. For two days she has not been eating or drinking very well. She did have some nausea and some abdominal pain today.  In the Emergency Room she was found to be in acute renal failure. The ER physician thought there was a urinary tract infection and ordered a urine culture and started antibiotics. She was initially tachycardic and has leukocytosis. Hospitalist services were contacted for admission.   PAST MEDICAL HISTORY:  1. Dementia.  2. Hypothyroidism.  3. Hypertension.  4. Gastroesophageal reflux disease.  5. Chronic thrombocytopenia.  6. Stent in the bile duct.   PAST SURGICAL HISTORY:  Stent in the bile duct and a repeat procedure.   ALLERGIES: Codeine.   MEDICATIONS:  1. Metoprolol 12.5 mg twice a day. 2. Prilosec 20 mg daily.  3. Remeron 15 mg, half tablet at bedtime.  4. Synthroid 75 mg at bedtime.   SOCIAL HISTORY: Lives with husband. Quit smoking 25 years ago. No alcohol. No drug use. 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   FAMILY HISTORY: Father died at 6462 of a myocardial infarction, was an alcoholic.  Mother died at 2794 of old age.  REVIEW OF SYSTEMS:  Difficult secondary to dementia. CONSTITUTIONAL: Positive for weight gain. No fever, chills, or sweats. EYES: She does wear glasses. EARS, NOSE, MOUTH, AND  THROAT: No hearing loss. No sore throat. No difficulty swallowing. CARDIOVASCULAR: No chest pain. No palpitations. RESPIRATORY: No shortness of breath. No cough. No sputum. No hemoptysis. GASTROINTESTINAL: Positive for nausea. No vomiting. Positive for abdominal pain. No diarrhea. No constipation. No bright red blood per rectum. No melena. GENITOURINARY: No burning on urination. No hematuria. MUSCULOSKELETAL: No joint pain or muscle pain. INTEGUMENT: Positive for itching on the back. PSYCHIATRIC: No anxiety or depression. NEUROLOGIC: No fainting or blackouts. ENDOCRINE: Positive for hypothyroidism. HEMATOLOGIC/LYMPHATIC: No anemia.   PHYSICAL EXAMINATION:  VITAL SIGNS: On presentation pulse 101, temperature 98, respirations 20, blood pressure 96/53, pulse oximetry 96% on room air.   GENERAL: No respiratory distress.   EYES: Conjunctivae and lids normal. Pupils equal, round, and reactive to light. Extraocular muscles intact. No nystagmus.   EARS, NOSE, MOUTH, AND THROAT: Tympanic membranes no erythema. Nasal mucosa no erythema. Throat no erythema. No exudate seen. Lips and gums no lesions.   NECK: No JVD. No bruits. No lymphadenopathy. No thyromegaly. No thyroid nodules palpated.   LUNGS: Lungs are clear to auscultation. No use of accessory muscles to breathe.   CARDIOVASCULAR: S1, S2 normal. No gallops, rubs, or murmurs heard. Carotid upstroke 2+ bilaterally. No bruits. Dorsalis pedis pulses 1+ bilaterally. Trace edema of the lower extremities.   ABDOMEN: Soft, slight tenderness in the left upper quadrant. No organomegaly/splenomegaly. Normoactive bowel sounds. No masses felt.   LYMPHATIC: No lymph nodes in the  neck.   MUSCULOSKELETAL: Trace edema. No clubbing. No cyanosis.   SKIN: No ulcers or lesions seen.   NEUROLOGIC: The patient follows simple commands. Cranial nerves II through XII grossly intact. The patient is moving all extremities. Able to straight leg raise bilaterally.    PSYCHIATRIC: The patient is a poor historian, alert to person and place.   LABORATORY, DIAGNOSTIC, AND RADIOLOGIC DATA: CT scan of the head showed no acute abnormality. Urinalysis showed 1+ blood, 1+ bacteria. Nitrites and leukocyte esterase are negative.   Chest x-ray:  No acute abnormality. White blood cell count 12, hemoglobin and hematocrit 13.5 and 39.3, platelet count 116, glucose 148, BUN 24, creatinine 1.87, sodium 138, potassium 3.3, chloride 104, CO2 24, calcium 8.6, total bilirubin 1.6. Other liver function tests: AST slightly elevated at 38, total protein 6.9. Albumin low at 3. CPK 667. Troponin negative. Ammonia less than 25. EKG shows sinus tachycardia at 101 beats per minute.   ASSESSMENT AND PLAN:  1. Acute renal failure with dehydration: We will give IV fluid hydration with normal saline. Continue to monitor creatinine on a daily basis.  2. Systemic inflammatory response syndrome with relative hypotension: Possibility of urinary tract infection. Blood cultures and urine cultures sent. Empiric IV Rocephin given. Hydrate with normal saline. Hold metoprolol at this time. Continue to monitor closely.  3. Dementia: Not on any medication for this. Mental status may get worse while here in the hospital. This was explained to the family.  4. Hypothyroidism: Continue levothyroxine.  5. Gastroesophageal reflux disease: Continue omeprazole.  6. Hypokalemia: Will replace orally and check a magnesium in the a.m.  7. Chronic thrombocytopenia: Platelet count is on the lower side.    CODE STATUS:  The patient is a DO NOT RESUSCITATE.    TIME SPENT ON ADMISSION: 55 minutes.     ____________________________ Herschell Dimes. Renae Gloss, MD rjw:bjt D: 12/11/2011 12:05:25 ET T: 12/11/2011 13:08:26 ET JOB#: [id number]  cc: Herschell Dimes. Renae Gloss, MD, <Dictator> Burley Saver, MD Salley Scarlet MD ELECTRONICALLY SIGNED 12/27/2011 17:28

## 2014-05-14 NOTE — Discharge Summary (Signed)
PATIENT NAME:  Terri Becker, BAUMBACH MR#:  [id number] DATE OF BIRTH:  12-Dec-1930  DATE OF ADMISSION:  12/11/2011 DATE OF DISCHARGE:  12/15/2011  PRIMARY CARE PHYSICIAN: Dr. Quillian Quince   FINAL DIAGNOSES:  1. Sepsis with Escherichia coli.  2. Acute renal failure, which improved.  3. Thrombocytopenia secondary to sepsis.  4. Dementia.  5. Hypothyroidism.  6. Gastroesophageal reflux disease.  7. Hypokalemia.  8. Hypomagnesemia.   MEDICATIONS ON DISCHARGE:  1. Prilosec 20 mg daily.  2. Synthroid 75 mcg daily.  3. Metoprolol 25 mg half-tablet twice a day.  4. Remeron 15 mg half-tablet at night.  5. Cephalexin 500 mg every eight hours until completion. A total of two week course of antibiotics given.  REFERRAL: Home health physical therapy    DIET: Low sodium diet, regular consistency.   ACTIVITY: Activity as tolerated.   FOLLOW-UP:  1. Follow-up with Dr. Niel Hummer of GI in a few weeks. 2. Follow-up with Dr. Quillian Quince in 1 to 2 weeks.   REASON FOR ADMISSION: The patient was admitted 12/11/2011 and discharged 12/15/2011. The reason why she came in is family had a hard time getting her out of bed.   HISTORY OF PRESENT ILLNESS: The patient is a 81 year old female with dementia, not the best historian. They had a hard time getting her out of bed, not eating. Her speech was a little slurred. ER physician thought there was a urinary tract infection, ordered a urine culture and started antibiotics. The patient was initially tachycardic and had leukocytosis. The patient was admitted with acute renal failure and dehydration, systemic inflammatory response syndrome with relative hypotension. Cultures were sent off and the patient was started initially on IV Rocephin.   LABORATORY AND RADIOLOGICAL DATA DURING THE HOSPITAL COURSE: Ammonia level less than 25. Troponin was negative. CPK 667. CK-MB 4.9. Glucose 148, BUN 24, creatinine 1.87, sodium 138, potassium 3.3, chloride 104, CO2 24, calcium 8.6. Liver  function tests total bilirubin 1.6, alkaline phosphatase 131, ALT 20, AST 38, albumin 3.0. White blood cell count 12.0, hemoglobin and hematocrit 13.5 and 39.6, platelet count 116.   EKG showed sinus tachycardia at 101 beats per minute, no acute ST-T wave changes.   Chest x-ray showed no acute abnormality.   Urinalysis showed 1+ bacteria but nitrites and leukocyte esterase were negative, 1+ blood. Urine culture was actually negative.   CT scan of the head showed no acute abnormality.   Blood cultures in all four bottles grew out Escherichia coli, resistant to Cipro and Levaquin but sensitive to everything else. Magnesium on the 17th came back at 1.6, creatinine down to 1.12. Platelet count on the 17th dipped down to 66. On the 18th platelet count dropped down to 41. Fibrinogen greater than 750. INR 1.1. D-dimer greater than 6. Fibrin degradation products greater than 10, less than 40. Platelet count recovered up to 73 upon discharge. White count upon discharge 4.4. Hemoglobin 11.1. Liver function tests normal range. Albumin dipped down to 2.   CONSULTANTS DURING THE HOSPITAL COURSE:  1. Physical therapy  2. Dr. Niel Hummer, Gastroenterology   3. Case was also discussed with Dr. Lorre Nick of Hematology but he did not see the patient in consultation secondary to platelet recovery     HOSPITAL COURSE PER PROBLEM LIST:  1. For the patient's sepsis with Escherichia coli, initially the patient was started on IV Rocephin. When there was some question about the sensitivities of the Escherichia coli, the patient was switched to ertapenem. Upon discharge on the 20th I  did confirm with microbiology this is not ESBL. It is sensitive to the cephalosporins. The patient was given a dose of ertapenem on the 20th and switched over to Keflex upon discharge. The patient will complete a total of 14 day course for the sepsis with Escherichia coli. Not sure where the source of this was. Initially it was thought to be the  urine but the urine culture actually grew out negative. I did speak with Dr. Niel HummerIftikhar because he did put a stent in the biliary tree which could be the source of this. Since the liver function tests were negative, he believed that this may not be the source but he will evaluate as outpatient to determine whether or not the stent needs to be taken out. He was hesitant on doing so with the platelet count being borderline low. The patient never really had any symptoms during the hospital course and never complained of anything. She did have a fever on the 19th of 101.7 but has been afebrile since. The patient was discharged home in stable condition.  2. Acute renal failure. This had improved with IV fluid hydration. The patient was given IV fluid hydration during the hospital course. Last creatinine was a creatinine of 1.06 on the 18th.   3. For the patient's thrombocytopenia, platelet count was low side when she came in at 116 and dropped down as low as 44 but did recover up to 73 upon discharge. I did speak with Dr. Lorre NickGittin as a curbside and he thinks that it was secondary to the sepsis which I believe that is the case also. I think the platelet count will continue to recover as outpatient. I do recommend checking a platelet count prior to any procedures being done once recovery is finished and antibiotics are finished.  4. Dementia. She is not on any medications for this.  5. Hypothyroidism. She was kept on her Synthroid.  6. Gastroesophageal reflux disease. She is on Prilosec.  7. Hypokalemia. The patient was replaced potassium during the hospital course. Since she is eating better, I did not feel the need to replace potassium as outpatient.  8. Hypomagnesemia. This was replaced during the hospital course. Since she is eating better, I did not feel the need to replace this as outpatient.   TIME SPENT ON DISCHARGE: 35 minutes.        CODE STATUS: The patient is a DO NOT RESUSCITATE.    ____________________________ Herschell Dimesichard J. Renae GlossWieting, MD rjw:drc D: 12/15/2011 16:02:17 ET T: 12/16/2011 12:01:51 ET JOB#: [id number]  cc: Herschell Dimesichard J. Renae GlossWieting, MD, <Dictator> Burley SaverL. Katherine Bliss, MD Lurline DelShaukat Iftikhar, MD Salley ScarletICHARD J Tresia Revolorio MD ELECTRONICALLY SIGNED 12/27/2011 17:29

## 2014-05-14 NOTE — Consult Note (Signed)
Chief Complaint:   Subjective/Chief Complaint Feels OK but had fever last night. Will repeat LFT's in am. Ifcontinues to be febrile will consider ERCP with stent remove/ replacement as biliary tree could be the source of bactremia. Will discuss with her husband in am.   Electronic Signatures: Lurline DelIftikhar, Quinn Bartling (MD)  (Signed [id number]-Nov-13 17:50)  Authored: Chief Complaint   Last Updated: 19-Nov-13 17:50 by Lurline DelIftikhar, Jefrey Raburn (MD)

## 2014-05-17 NOTE — Op Note (Signed)
PATIENT NAME:  Terri Becker, Terri Becker MR#:  [id number] DATE OF BIRTH:  [birth year]  DATE OF PROCEDURE:  08/11/2012  PREOPERATIVE DIAGNOSIS:  ESBL E. coli urinary tract infection.  POSTOPERATIVE DIAGNOSIS:  ESBL E. coli urinary tract infection.  PROCEDURES:  1. Ultrasound guidance for vascular access to right basilic vein.  2. Fluoroscopic guidance for placement of catheter.  3. Single lumen PICC line, right arm.  SURGEON:  Levora DredgeGregory Schnier, M.D.  ANESTHESIA: Local.   ESTIMATED BLOOD LOSS: Minimal.   INDICATION FOR PROCEDURE:  Requiring IV antibiotics greater than 5 days.   DESCRIPTION OF PROCEDURE: The patient's right arm was sterilely prepped and draped, and a sterile surgical field was created. The basilic vein was accessed under direct ultrasound guidance without difficulty with a micropuncture needle and permanent image was recorded. 0.018 wire was then placed into the superior vena cava. Peel-away sheath was placed over the wire. A single lumen peripherally inserted central venous catheter was then placed over the wire and the wire and peel-away sheath were removed. The catheter tip was placed into the superior vena cava and was secured at the skin at 38 cm with a sterile dressing. The catheter withdrew blood well and flushed easily with heparinized saline. The patient tolerated procedure well.   ____________________________ Renford DillsGregory G. Schnier, MD ggs:ea D: 08/11/2012 17:47:11 ET T: 08/12/2012 06:12:23 ET JOB#: [id number]  cc: Renford DillsGregory G. Schnier, MD, <Dictator> Renford DillsGREGORY G SCHNIER MD ELECTRONICALLY SIGNED 08/12/2012 11:18

## 2014-05-17 NOTE — H&P (Signed)
PATIENT NAME:  ZHANE, DONLAN MR#:  [id number] DATE OF BIRTH:  [birth year]  DATE OF ADMISSION:  08/08/2012  PRIMARY CARE PHYSICIAN:  Dr. Clemmie Krill  CHIEF COMPLAINT: Brought in secondary to vomiting.   HISTORY OF PRESENT ILLNESS: This is an 81 year old female who resides at [facility]. The patient is unable to give any history secondary to dementia. A son at the bedside able to give history. She had a rash on her legs on 07/04.  They have been using some Benadryl. She also had a urinary tract infection and was given doxycycline, but the patient vomited with the doxycycline and was switched over to [place]. Today, the patient vomited up lunch and became less responsive and was brought in to the ER for further evaluation and creatinine was found to be 5.76, and BUN 71 with a GFR of 6. Urinalysis also positive for leukocyte esterase of 3+ and 2+ blood.  Hospitalist services were contacted for further evaluation.   PAST MEDICAL HISTORY: Dementia, hypothyroidism, hypertension, gastroesophageal reflux disease, chronic thrombocytopenia, stent in the bile duct that was placed and then removed.   PAST SURGICAL HISTORY: Stent in the bile duct.   ALLERGIES: CODEINE.   MEDICATIONS: Include aspirin 81 mg daily, Benadryl 12.5 mg as needed for rash or itching, Depakote 125 mg 2 capsules twice a day, Allegra 180 mg daily, levothyroxine 75 mcg daily MAPAP 500 mg every 4 hours as needed for pain or fever, metoprolol tartrate 25 mg 1/2 tablet twice a day, omeprazole 20 mg daily.   SOCIAL HISTORY: Currently at [facility]. Quit smoking many years ago. No alcohol. No drug use. Worked as a bookkeeper in the past.   FAMILY HISTORY: Father died at 40 of a myocardial infarction. He also was an alcoholic. Mother died at 6 of old age.   REVIEW OF SYSTEMS: Unable to obtain secondary to dementia. Not reliable answers with the patient.   PHYSICAL EXAMINATION:  VITAL SIGNS: Temperature 97.4,  pulse 70, respirations 18, blood pressure 116/58, pulse oximetry 98% on room air.   GENERAL: No respiratory distress.   EYES: Conjunctivae and lids normal. Pupils equal, round and reactive to light. Difficult to test extraocular muscles because she is not following that command as I would like, but she does track me left and right and able to move her extraocular muscles left and right.   EARS, NOSE, MOUTH, AND THROAT: Tympanic membranes tight and blocked by wax. Nasal mucosa: No erythema.  THROAT: No erythema, no exudate seen. Lips and gums: No lesions.   NECK: No JVD. No bruits. No lymphadenopathy. No thyromegaly. No thyroid nodules palpated.   LUNGS: Clear to auscultation. No use of accessory muscles to breathe. No rhonchi, rales or wheeze heard.   CARDIOVASCULAR: S1, S2 normal. No gallops, rubs or murmurs heard. Carotid upstroke 2+ bilaterally. No bruits.   EXTREMITIES: Dorsalis pedis pulses 2+ bilaterally. Trace edema of the lower extremity.   ABDOMEN: Soft, nontender. No organomegaly/splenomegaly. Normoactive bowel sounds. No masses felt.   LYMPHATIC: No lymph nodes in the neck.   MUSCULOSKELETAL: No clubbing, edema or cyanosis.   SKIN:  Positive for petechial rash on the feet bilaterally.  On the legs, it seemed to have disappeared as per the family,   NEUROLOGIC: Cranial nerves II through XII grossly intact. Deep tendon reflexes 1/2+  bilateral lower extremity. The patient is able to straight leg raise bilaterally.   PSYCHIATRIC: The patient is alert, does answer questions, yes or no, but  unreliable with the answers. Follow some commands but not others.    LABORATORY AND RADIOLOGICAL DATA: Ammonia level less than 25. CT scan of the head: No acute intracranial abnormality. White blood cell count 7.4, hemoglobin and hematocrit 10.5 and 30.9, platelet count 163. Glucose 83, BUN 71, creatinine 5.76, sodium 134, potassium 3.5, chloride 103, CO2 21, calcium 8.6. Liver function tests are  normal range. Albumin low at 2.9. GFR 6. Troponin negative. Urinalysis 2+ blood, 3+ leukocyte esterase. Valproic acid is 45.   ASSESSMENT AND PLAN: 1. Acute renal failure. The patient was recently given doxycycline and then Macrobid. I am wondering if that is contributing. The patient did have nausea and vomiting, which could be uremic symptoms since the GFR is 6. I will give IV fluid hydration and continue to monitor her on a daily basis. With the creatinine. I will get a bladder scan and a sonogram of the kidneys and bladder and continue to monitor. With her dementia, I am hesitant on placing a Foley catheter I do not need to.  2. A petechial rash on bilateral feet. I am wondering if this is leukoclastic vasculitis. I will send off an ESR, ANA, ANCA panel, cryoglobulin, hepatitis B and C profile, protein electrophoresis and a rheumatoid factor. If this workup is negative, can consider a dermatology appointment for biopsy, but since the rash on the legs seemed to have disappeared and just on the feet, will continue to monitor at this point.  3. Urinary tract infection. Urine culture was sent off. We will start on IV Rocephin.  4. Acute encephalopathy with underlying dementia likely secondary to the acute renal failure and uremia and also urinary tract infection. We will continue to monitor.  5. Hypothyroidism. Check a TSH in the morning. Continue levothyroxine.  6. Hypertension. Continue low-dose metoprolol. Continue to monitor.  7. Gastroesophageal reflux disease on omeprazole.  TIME SPENT ON ADMISSION: 55 minutes.   CODE STATUS: The patient is a DO NOT RESUSCITATE.    ____________________________ Tana Conch. Leslye Peer, MD rjw:rw D: 08/08/2012 17:35:13 ET T: 08/08/2012 18:21:09 ET JOB#: [id number]  cc: Tana Conch. Leslye Peer, MD, <Dictator> Valetta Close, MD Marisue Brooklyn MD ELECTRONICALLY SIGNED 08/11/2012 17:20

## 2014-05-17 NOTE — Discharge Summary (Signed)
PATIENT NAME:  Anne NgFLEISCHER, Houston C MR#:  [id number] DATE OF BIRTH:  September 15, 1930  DATE OF ADMISSION:  08/08/2012 DATE OF DISCHARGE:  08/17/2012  PRIMARY CARE PHYSICIAN:  Clayborn BignessKatherine Bliss, MD  ADDENDUM:  For detailed discharge summary, please refer to the interim discharge summary dictated by Dr. Cherlynn KaiserSainani. The patient's discharge on hold due to worsening renal function yesterday. According to Dr. Cherylann RatelLateef, the patient has been treated with IV fluid support. Renal function is improving. BUN decreased to 24, creatinine decreased to 12.67, but the patient had hypomagnesemia with magnesium level at 1.5, potassium 3.2. The patient is being treated with potassium and magnesium supplement. The patient's vital signs are stable. She is clinically stable and will be discharged to a skilled nursing facility today. I discussed the patient's discharge plan with the patient's granddaughter, case Production designer, theatre/television/filmmanager and nurse.   TIME SPENT: About 35 minutes.  ____________________________ Shaune PollackQing Hayven Fatima, MD qc:sb D: 08/17/2012 10:33:06 ET T: 08/17/2012 11:05:30 ET JOB#: [id number]  cc: Shaune PollackQing Naydene Kamrowski, MD, <Dictator> Shaune PollackQING Zadiel Leyh MD ELECTRONICALLY SIGNED 08/17/2012 15:27

## 2014-05-19 NOTE — Consult Note (Signed)
PATIENT NAME:  Terri Becker, Terri Becker MR#:  [id number] DATE OF BIRTH:  03/18/1930  DATE OF CONSULTATION:  01/19/2011  REFERRING PHYSICIAN:   CONSULTING PHYSICIAN:  Knute Neu. Lorre Nick, MD  HISTORY OF PRESENT ILLNESS: [name] is a patient who was admitted on December 21st with evidence of septic shock, tachycardia, abdominal pain, and the patient was admitted for a management with acute cholangitis. Pertinent history is that the patient had a stent placed and ERCP in early December for obstructive jaundice and elevated CA-19-9. She was given fluids, antibiotic support, and Surgery and GI were consulted and evaluated. The patient has improved with minimal residual pain, stable vital signs, and normalization of white count and improvement in liver function tests. There was also improving renal function. There is underlying stable dementia.   Oncology is consulted with prior obstructive jaundice, although there is a stone and evidence of a stricture in the absence of a mass. There was also a CA-19-9 over 8000; at one time reading was 10,000 so that pancreatic or cholangiocarcinoma was suspected although again on CT no mass was discovered. The patient was planned for but did not follow-up with plans for endoscopic ultrasound.   PAST MEDICAL HISTORY: 1. Hypertension. 2. Arrhythmia.  3. Prior appendectomy.  4. Tonsillectomy.   5. Initial presentation November 30th with obstructive jaundice. Had ERCP and stent placement. 6. History of hypothyroid. 7. History of lumpectomy. By history this was benign.   MEDICATIONS:  1. Zofran. 2. Synthroid. 3. Prilosec.  4. Metoprolol.  SOCIAL HISTORY: No alcohol. No tobacco. Lives at home with her husband.   ADDITIONAL SYSTEM REVIEW: When I saw the patient and reviewed with her and the family, she was not in any distress. She said that she still hadn't moved her bowels because she had hardly eaten and was just getting her appetite back. She was not complaining of  abdominal pain but did have some minimal tenderness. Denied headache, dizziness, chills, sweats, cough, shortness of breath, rash, bruising. She has arthritis, was not in acute discomfort, was not having any edema. Family relates diagnosis of Alzheimer's dementia recently with some cognitive impairment and memory lapses although she is functional and recognizes family and carries on daily activities.   PHYSICAL EXAMINATION:   GENERAL: The patient was alert and cooperative. She did have some memory lapses for recent events and for details of her history. She denied any acute distress, pain or shortness of breath and was not coughing or wheezing. She was in bed moving all extremities. I did not test her gait. There was some pallor. No thrush in the mouth.   LYMPH: No lymphadenopathy in the neck, supraclavicular, submandibular, or axilla.   ABDOMEN: Minimally tender in the periumbilical area but no guarding or rigidity. No mass. No rebound.   ABDOMEN: Not tympanitic but was slightly distended. No definite ascites.   HEART: Regular.   LUNGS: Clear. No wheezing or rales.   SKIN: No significant bruising. No rash. No edema. Grossly nonfocal.   LABORATORY, DIAGNOSTIC, AND RADIOLOGICAL DATA: Chemistries creatinine 0.99, potassium low at 3, bilirubin still elevated at 4.2. The liver function tests were normal. Albumin was low at 1.8. White count 7.2, hemoglobin 10.5, platelets 111. On December 21st hemoglobin was 12.7 and the platelets were 158. On November 30th the hemoglobin was 13 and the platelets were 359.   The CA-19-9 from recent hospitalization back on November 30th was 10,018. CT of abdomen and pelvis with contrast on December 1st showed intrahepatic and extrahepatic biliary  ductal dilatation to the level of the pancreatic head but a definite mass was not seen.   IMPRESSION AND PLAN: The patient has a very elevated CA-19-9 suspicious for pancreatic or biliary tree cancer but there is no definite  mass on CT and there is no definite mass on ERCP. In addition, there is some anemia that looks to be due to acute illness and infection. Recently baseline was normal and platelets are slightly low now with infection, antibiotic effects, poor nutrition and probably exhaustion of marrow reserve but is unlikely that there is another primary hematology disorder.   The patient does not have significant cardiac or pulmonary underlying disease but does have early dementia and mental status waxed and waned, looks to be confused, has memory lapses although she is still very functional. Overall, however, I think this is one poor prognostic feature for very aggressive interventions and should be considered if she was to be ultimately a surgical candidate. In discussion with the patient and family today, they would not want to pursue chemotherapy or aggressive or toxic measures if they were palliative or life-prolonging or noncurative and so she is really based on performance status and wishes not a good candidate for unlikely to get significant benefit from chemotherapy if we made a definite diagnosis. The patient could be a surgical candidate if she were identified by additional imaging studies to have a focal treatable potentially curable tumor but, otherwise, the patient and family are really not interested in palliation. With respect to the cytopenia, CBC should be followed. We can check iron and B12. I would check a Coombs test. Would just watch to see that the platelets reconstitute when infection is controlled and the patient unlikely to benefit from other hematology evaluations.   SUMMARY AND PLAN:  1. I would continue to watch her CBC.  2. Would document a B12 level. 3. Would check a Coombs test. 4. Would monitor tumor markers.  5. Would wait to see if GI was going to do another ERCP.  6. If the patient and family wanted to pursue, I would recommend endoscopic ultrasound to diagnose and stage and if a mass  is seen to perform a biopsy for definitive diagnosis, although is more likely to have prognostic value.   I will follow the patient's clinical status.   ____________________________ Knute Neuobert G. Lorre NickGittin, MD rgg:drc D: 01/19/2011 10:55:59 ET T: 01/20/2011 10:02:17 ET JOB#: [id number] cc: Knute Neuobert G. Lorre NickGittin, MD, <Dictator> Marin RobertsOBERT G GITTIN MD ELECTRONICALLY SIGNED 02/03/2011 14:49

## 2014-05-19 NOTE — Consult Note (Signed)
PATIENT NAME:  Terri Becker, Terri MR#:  [id number] DATE OF BIRTH:  May 28, 1930  DATE OF CONSULTATION:  01/25/2011  REFERRING PHYSICIAN:  Enedina Finner, MD CONSULTING PHYSICIAN:  Sahithi Ordoyne B. Nargis Abrams, MD  REASON FOR CONSULTATION: To evaluate a suicidal patient.   IDENTIFYING DATA: Terri Becker is a 81 year old year-old female with no past psychiatric history and multiple medical problems.   CHIEF COMPLAINT: "I am fine now."   HISTORY OF PRESENT ILLNESS: Terri Becker was readmitted to the medical floor for complications of a stent placement for obstructive jaundice. She developed a conviction that she has cancer. She has not been feeling well lately. She made a comment to her husband and hospital staff that she would rather die. By the time I did see the patient, she received good news that ERCP brushings were negative for cancer and she is much calmer, optimistic about the future, and ready to plan to go to a skilled nursing facility for rehab before returning home with her husband. She is pleasant, polite, and cooperative. She is very engaging, funny, and interacting well with her husband. It is clear that she is relieved to receive good news. She makes it very clear that she never meant to hurt herself or others. Her husband is absolutely convinced that she said it out of frustration. There is no history of mental illness and the patient has never been treated. She was seen by Dr. Guss Bunde over the weekend who diagnosed the patient with a psychotic depression and cognitive decline and initiated Remeron, Aricept, and Haldol.  PAST PSYCHIATRIC HISTORY: As above. The patient has never been treated, has never seen a psychiatrist. There were no hospitalizations. No suicide attempts.   FAMILY PSYCHIATRIC HISTORY: None reported.   MEDICAL HISTORY:  1. Hypothyroidism.  2. Dyslipidemia.  3. Irregular heart beat.  4. Breast lumpectomy. 5. Tonsillectomy.  6. Appendectomy.  7. Septic shock due to acute  cholangitis on admission.  8. Obstructive jaundice with elevated CA 19.9. 9. Status post ERCP stent placement. 10. Elevated liver function tests. 11. Acute renal failure, now resolved.   MEDICATIONS ON ADMISSION:  1. Zofran 4 mg every six hours. 2. Synthroid 88 mcg daily.  3. Prilosec 20 mg daily.  4. Metoprolol 12.5 mg twice daily.   MEDICATIONS AT THE TIME OF CONSULTATION:  1. Synthroid 0.088 mg daily.  2. Morphine injection. 3. Zofran injection. 4. Lovenox. 5. Protonix 40 mg daily.  6. Aricept 5 mg daily for three days, then 10 mg daily.  7. Lorazepam 0.5 mg every six hours as needed.  8. Remeron 15 mg daily. 9. Albuterol as needed.  10. Haldol 1 mg twice daily.  11. Haldol 0.5 mg q.6 hours as needed for agitation.   ALLERGIES: Codeine.   SOCIAL HISTORY: She lives with her husband. They have excellent interaction. They are both in agreement that the patient needs to go to skilled nursing facility to fully recover. They are forward thinking and optimistic about the future.   REVIEW OF SYSTEMS: CONSTITUTIONAL: No fevers or chills. Positive for some weight gain. The patient is worried to gain more weight. EYES: No double or blurred vision. ENT: No hearing loss. RESPIRATORY: No shortness of breath. CARDIOVASCULAR: No chest pain or orthopnea. GASTROINTESTINAL: Positive for recent abdominal problems. GENITOURINARY: No incontinence or frequency. ENDOCRINE: Positive for hypothyroidism. LYMPHATIC: No anemia or easy bruising. INTEGUMENTARY: No acne or rash. MUSCULOSKELETAL: No muscle or joint pain. NEUROLOGIC: No tingling or weakness. PSYCHIATRIC: See history of present illness for details.  PHYSICAL EXAMINATION:  VITAL SIGNS: Blood pressure 119/81, pulse 114, respirations 20, temperature 97.4.   GENERAL: This is an obese female in no acute distress.   The rest of the physical examination is deferred to her primary attending.   MUSCULOSKELETAL: There is normal muscle tone in all  extremities. No stiffness or cogwheeling. No Tremor.   LABORATORY, DIAGNOSTIC, AND RADIOLOGICAL DATA: Chemistries - blood glucose 100, BUN 9, creatinine 0.55, sodium 147, potassium 3.1 LFTs - total protein 5.1, albumin 1.8, total bilirubin 2.1. CBC within normal limits with mild anemia.   MENTAL STATUS EXAMINATION: The patient is alert and oriented to person, place, time, and situation. She is pleasant, polite, and cooperative. She is funny and interactive. She maintains good eye contact. Her speech is of normal rhythm, rate, and volume. Her mood is fine with full affect. Thought processing is logical and goal oriented. Thought content: She denies suicidal or homicidal ideation but I was asked to assess her after she voiced the vague suicidal ideation in the context of worsening of her physical condition and worries about having cancer. There are no thoughts of hurting others. There are no delusions or paranoia. There are no auditory or visual hallucinations. Her cognition is grossly intact. She registers three out of three and recalls two out of three objects after five minutes. She can spell world forward and backward. She can name the President. Her abstraction is intact. Her insight and judgment are fair.   SUICIDE RISK ASSESSMENT: This is a patient with no past psychiatric history under duress from physical illness and understandable worries about having cancer. She has an excellent relation with her husband who is very supportive. They are both forward thinking and optimistic about the future.   DIAGNOSES:  AXIS I: Adjustment disorder with depressed mood, rule out mild dementia.   AXIS II: Deferred.   AXIS III: Multiple medical problems as above.   AXIS IV: Worsening of physical illness, loss of way of life.   AXIS V: GAF 45.   PLAN:  1. The patient does not meet criteria for involuntary inpatient psychiatric commitment. I will discontinue her sitter. She is able to contract for safety.   2. I will continue Remeron 15 mg at night although it may increase weight, it helps the patient sleep. I will hold Aricept. It is unclear whether she has dementia. She has been quite sharp with me. This may not be the best time to make a decision. Aricept does have side affects. Maybe she should complete her rehabilitation before we assess her again.  3. She is on Haldol. The advantage of it is uncertain at this point. I will continue low dose Haldol for now.  4. I will continue working with this patient.  ____________________________ Ellin GoodieJolanta B. Jennet MaduroPucilowska, MD jbp:rbg D: 01/26/2011 16:51:40 ET T: 01/27/2011 11:23:55 ET JOB#: [id number]  cc: Elidia Bonenfant B. Jennet MaduroPucilowska, MD, <Dictator> Shari ProwsJOLANTA B Affie Gasner MD ELECTRONICALLY SIGNED 02/03/2011 23:34

## 2014-05-19 NOTE — H&P (Signed)
PATIENT NAME:  Terri Becker, Terri Becker MR#:  [id number] DATE OF BIRTH:  [birth year]  DATE OF ADMISSION:  01/15/2011  PRIMARY CARE PHYSICIAN: Dr. Clayborn BignessKatherine Bliss  CHIEF COMPLAINT: Abdominal pain and poor appetite.   HISTORY OF PRESENT ILLNESS: Ms. Terri NaasFleischer is a 81 year old Caucasian female well known to our service from previous admission which was from 11/30 to 12/05 where she presented with obstructive jaundice. Underwent stent placement after ERCP was done by Dr. Niel HummerIftikhar. Patient was supposed to follow up with Dr. Niel HummerIftikhar to get possible outpatient endoscopic ultrasound to evaluate her elevated CA-19-9 and obstructive jaundice, however, patient failed to follow up. Comes in today with increasing abdominal pain, poor p.o. intake, severe dehydration and hypotension with systolic blood pressure 65/38.   In the Emergency Room, patient was found to be in septic shock being tachycardic, abdominal pain with a stent placement hence was started on IV fluids and received a dose of IV Zosyn. GI and surgical consultations were placed. Patient is being admitted for further evaluation and management for her septic shock.   PAST MEDICAL HISTORY:  1. Hypothyroidism.  2. Hyperlipidemia.  3. Irregular heart beat.  4. Breast lumpectomy.  5. Tonsillectomy and adenoidectomy.  6. Appendectomy.  7. Total hysterectomy.   HOME MEDICATIONS:  1. Zofran 4 mg every six hours.  2. Synthroid 88 mg daily.  3. Prilosec over-the-counter 20 mg p.o. daily.  4. Metoprolol 12.5 mg b.i.d.   SOCIAL HISTORY: Lives at home with her husband. Nonsmoker, nonalcoholic.   FAMILY HISTORY: Denies any history of malignancy, coronary artery disease, or strokes.   REVIEW OF SYSTEMS: CONSTITUTIONAL: No fever. Positive for fatigue, weakness, weight loss. EYES: No blurred or double vision. ENT: No tinnitus, ear pain, hearing loss. RESPIRATORY: No cough, wheeze, or hemoptysis. CARDIOVASCULAR: No chest pain, orthopnea, edema.  GASTROINTESTINAL: Positive for nausea, weight loss and abdominal pain. GENITOURINARY: No dysuria, hematuria. ENDOCRINE: No polyuria or nocturia. HEMATOLOGY: No anemia or easy bruising. SKIN: No acne, rash. MUSCULOSKELETAL: Positive for arthritis. NEUROLOGIC: No cerebrovascular accident, transient ischemic attack. PSYCH: No anxiety or depression. All other systems reviewed and negative.   PHYSICAL EXAMINATION:  GENERAL: Patient appears critically ill and severely jaundiced.  VITAL SIGNS: Temperature 97.8, pulse 106, blood pressure 80/38, sats 94% on room air.   HEENT: Atraumatic, normocephalic. Pupils are equal, round, and reactive to light and accommodation. Extraocular movements intact. Oral mucosa is dry.   NECK: Supple. No JVD. No carotid bruit.   RESPIRATORY: Clear to auscultation bilaterally. No rales, rhonchi, respiratory distress, or labored breathing.   CARDIOVASCULAR: Tachycardia present. No murmur heard. PMI not lateralized. Chest nontender.   EXTREMITIES: Good femoral pulses. Feeble pedal pulses. No lower extremity edema.   ABDOMEN: Soft. There is some tenderness present in the right upper quadrant. No abdominal guarding, rigidity. Bowel sounds are present.    NEUROLOGIC: Grossly intact cranial nerves II through XII. No motor or sensory deficit. Patient does have generalized weakness.   PSYCH: Patient is awake, alert, oriented x3.   SKIN: Warm and dry, appears to be severely jaundiced.   LABORATORY, DIAGNOSTIC AND RADIOLOGICAL DATA: PT-INR 16.8 and 1.4. Plasma ammonia is less than 25. Ultrasound abdomen shows cholelithiasis with thickening of the gallbladder wall suspicious for cholecystitis. There is dilatation of the common bile duct with dilated intrahepatic duct observed. There is suspected shadowing echogenicity visualized within the common duct consistent with choledocholithiasis. Lactic acid 5.3, white count 12.3, hemoglobin and hematocrit 12.7 and 37.9, platelet count 158,  MCV 91, lipase 72, total  bilirubin 8.7, SGPT 136, SGOT 183, total protein 5.6, albumin 2.3. Troponin 0.03.   ASSESSMENT: 81 year old Terri Becker with:   1. Septic shock suspected due to acute cholangitis. Patient is status post stent with ERCP done first part of December due to her obstructive jaundice. 2. Obstructive jaundice with elevated CA-19-9 status post ERCP with stent placement in early part of December 2012 worrisome for malignancy. Patient was supposed to endoscopic ultrasound for further evaluation, however, she failed to follow up.  3. Cholelithiasis with mild thickening of gallbladder wall with suspicion for acute mild cholecystitis.  4. Hypotension.  5. Elevated liver function tests due to #1.  6. Acute renal failure, appears prerenal azotemia due to poor p.o. intake.   PLAN:  1. Admit patient to CCU.  2. Will give IV fluids wide open.  3. Use IV pressors as needed.  4. N.P.O. for now.  5. IV Zosyn around-the-clock.  6. Continue IV morphine p.r.n.  7. Protonix b.i.d.  8. GI consultation with Dr. Bluford Kaufmann and surgical consultation with Dr. Michela Pitcher.  9. Follow blood cultures and CBC along with LFTs.  10. Further work-up according to patient's clinical course. Critical nature of illness was explained to patient and her husband, agreeable to it.  11. CODE STATUS: Patient is FULL CODE.        CRITICAL TIME SPENT: 50 minutes.   ____________________________ Wylie Hail Allena Katz, MD sap:cms D: 01/15/2011 16:48:19 ET T: 01/15/2011 17:29:24 ET JOB#: [id number]  cc: Terri Becker A. Allena Katz, MD, <Dictator> Burley Saver, MD Willow Ora MD ELECTRONICALLY SIGNED 01/28/2011 14:43

## 2014-05-19 NOTE — Consult Note (Signed)
Brief Consult Note: Diagnosis: Adjustment disorder with depressed mood.   Patient was seen by consultant.   Consult note dictated.   Recommend further assessment or treatment.   Orders entered.   Discussed with Attending MD.   Comments: [name] is no longer suicidal or upset. She is ready to transfer to SNF for further recovery. The famikly complains that the patient has been somnolent thisd morning after she was given a pill.   MSE: Alert, sligth psychomotor retardation likely from Haldol 1 mg given this am. Good mood, normal affect, no safety issues, no psychosis, good I/J.   PLAN: 1. Please contuinue Remeron 15 mg at night for depression, anxiety. It may cause weight gain.  2. I will hold Aricept. The patient is fully oriented, appropriately funny and engaging. This is for her PCP to decide later.  3. I will d/c Haldol 1 mg twice daily.   4. I will sign off. Please discharge as aprropriate.  Electronic Signatures: Kristine LineaPucilowska, Tigran Haynie (MD)  (Signed 01-Jan-13 14:10)  Authored: Brief Consult Note   Last Updated: 01-Jan-13 14:10 by Kristine LineaPucilowska, Prynce Jacober (MD)

## 2014-05-19 NOTE — Consult Note (Signed)
Becker NAME:  Terri Becker, Terri Becker MR#:  [id number] DATE OF BIRTH:  1930/10/24  DATE OF CONSULTATION:  01/15/2011  REFERRING PHYSICIAN:   CONSULTING PHYSICIAN:  Rodman Key, NP  REASON FOR CONSULTATION: Jaundice, elevation in total bilirubin level.   HISTORY OF PRESENT ILLNESS: Terri Becker is a 81 year old Caucasian female who was just admitted 12/25/2010 and seen in consultation by Dr. Niel Hummer. She has significant medical history of hypertension, hypothyroidism, and Alzheimer's disease. She was evaluated for Terri concern of jaundice at that time. She had an ERCP done on 12/28/2010 by Dr. Niel Hummer with findings of biliary tree was swept with a 12 mm balloon starting at Terri bifurcation. Nothing was noted. Occlusion cholangiogram did not show any filling deficits in Terri bile duct. A very short distal biliary stricture was suspected. Cytology cells were obtained for cytology. One 10 French x 5 cm temporary stent with a single external flap and a single internal flap was placed 4 cm into Terri bile duct. Clear fluid flowed through Terri stent.   CEA level done December 3rd was less than 5.6. CA-19-9 was noted to be abnormally elevated. Fluid which was sent off for cytology at Terri time of Terri Becker ERCP showed atypical-appearing granular cells being present. Cytologic findings should be evaluated in conjunction with clinical and radiological findings.   Abdominal ultrasound was performed today's date which revealed liver, pancreas, abdominal aorta inferior vena cava showing no significant abnormalities. Terri spleen measures 13.66 cm in maximum diameter and mildly enlarged. There are dilated intrahepatic biliary ducts. Common bile duct is enlarged measuring 9.9 and 0.8 mm in maximum diameter. There is a shadowing echodensity in Terri common bile duct compatible with choledocholithiasis. There are again noted multiple echodensities in Terri gallbladder compatible with gallstones. There is thickening of Terri gallbladder  wall which now measures 4.2 mm in maximum thickness. No pericholecystic fluid is seen. Terri kidneys show no evidence of hydronephrosis and no ascites noted.   LABORATORY EVALUATION ON ADMISSION: BUN was elevated at 27 with a creatinine being elevated at 2.38, CO2 low at 19. Hepatic panel total protein 5.6, albumin 2.3, total bilirubin 8.7, alkaline phosphatase 172, AST 183 and ALT is 136. Troponin 0.03. CBC WBC count elevated at 12.3 with an RDW elevation of 18.7. Lactic acid elevated at 5.30.   In obtaining history from Terri husband, he does state that Terri Becker initial onset of abdominal discomfort was 01/06/2010. She had had these episodes occurring on a variable basis over Terri past year. Pain has progressively become worse and frequency lasting longer in length, rates Terri pain currently at an 8 on a scale of 1 to 10. Pain starts epigastrically and progresses to Terri Becker complete lower abdomen. Very minimal caloric intake. Terri Becker is eating mostly toast, crackers, and Ensure. She has had a 30 pound weight loss over Terri past year unintentionally. 70% of Terri time she feels like she is nauseated, no vomiting. Known history of acid reflux which has progressively become worse. No chest pain. No shortness of breath. No energy. Sleeping all Terri time according to Terri Becker husband and significant for generalized itching. She continues to present with evidence of jaundice. Afebrile at home. With having Terri stent placed at Terri time of Terri Becker ERCP, Terri Becker is not able to state that Terri pain actually progressively becomes better.   PAST MEDICAL HISTORY:  1. Hypertension. 2. Hypothyroidism.  3. Alzheimer's.  4. Gastroesophageal reflux disease.   PAST SURGICAL HISTORY:   1. Tonsillectomy.  2. Hysterectomy. 3.  Breast lumpectomy. 4. Appendectomy.   FAMILY HISTORY: No family history of neoplasm. No heart disease, diabetes. Father deceased at Terri age of 79 of alcoholism. Mother deceased at Terri age of 79 of natural  causes.   SOCIAL HISTORY: Remote tobacco use, quit 28 years ago. No history of heavy alcohol consumption.   REVIEW OF SYSTEMS: All 10 systems otherwise unremarkable than what is stated above. Again, history is obtained from Terri husband as Terri Becker does have a known history of Alzheimer's.   PHYSICAL EXAMINATION:   VITAL SIGNS: Temperature 97.8, pulse 107, respirations 20, blood pressure 85/39, pulse oximetry 93% on room air.   GENERAL: Well developed, well nourished 81 year old female who does appear to be mildly ill, sleepy though arousable to verbal stimuli.   HEENT: Normocephalic, atraumatic. Pupils equal, reactive to light. Sclerae anicteric. Conjunctivae clear.   NECK: Supple. Trachea midline. No lymphadenopathy or thyromegaly.   PULMONARY: Symmetric rise and fall of chest. Clear to auscultation throughout.   CARDIOVASCULAR: Regular rate and rhythm, S1, S2 tachycardic.   ABDOMEN: Mildly distended but still soft, more prominence noted to left lower quadrant. No rebound tenderness. Evidence of peritoneal irritation noted with movement. Hypoactive bowel sounds. No bruits. No masses. Marked discomfort throughout entire abdomen.   RECTAL: Deferred.   EXTREMITIES: Moving all four extremities. No contractures. No clubbing. No edema.    SKIN: Gross evidence of jaundice. Evidence of itching macular scant rash noted to trunk.   NEUROLOGIC: Alert and oriented x3. No gross focal deficits noted.   LABORATORY, DIAGNOSTIC, AND RADIOLOGICAL DATA: Results as noted under history section.   IMPRESSION: Obstructive jaundice in correlation with findings of choledocholithiasis though do suspect possible malignancy despite cytology fluid results from last admission given Terri length of presentation of Terri Becker symptoms as well as associated weight loss and elevation in CA-19-9.   PLAN:  1. Terri Becker presentation was discussed with Dr. Lutricia FeilPaul Oh. Terri decision will be made for Terri Becker to proceed  forward with an ERCP when clinically feasible. This is to be decided still.  2. Continuation of antibiotic therapy as ordered.  3. Close monitoring of vital as well as hemodynamic status and laboratory studies, specifically, total bilirubin, albumin, liver enzymes, and PT, INR.   These services provided by Rodman Keyawn S. Shaheem Pichon, NP under collaborative agreement with Lutricia FeilPaul Oh, MD.  Thank you for allowing us to participate in Terri care of Terri Becker.   ____________________________ Rodman Keyawn S. Alexzavier Girardin, NP dsh:drc D: 01/15/2011 16:05:17 ET T: 01/15/2011 16:32:19 ET JOB#: [id number]  cc: Rodman Keyawn S. Destenie Ingber, NP, <Dictator> Rodman KeyAWN S Koa Palla MD ELECTRONICALLY SIGNED 01/27/2011 7:42

## 2014-05-19 NOTE — Discharge Summary (Signed)
PATIENT NAME:  Terri Becker, Terri Becker MR#:  [id number] DATE OF BIRTH:  June 29, 1930  DATE OF ADMISSION:  01/15/2011 DATE OF DISCHARGE:  01/27/2011  DISPOSITION: [facility]  DISCHARGE DIAGNOSES:  1. Septic shock due to cholangitis, cholecystitis, and obstructive jaundice status post ERCP with stent in common bile duct.  2. Elevated CA-19-9 with negative CAT scan and negative brushings from ERCP.  3. Depression.  4. Hypertension.  5. Hypothyroidism.  6. Decondition.   DISCHARGE MEDICATIONS:  1. Synthroid 88 mcg p.o. daily.  2. Metoprolol 25 mg p.o. twice a day. 3. Zofran 4 mg as needed for nausea. 4. Prilosec 20 mg daily.   New Medicine: Remeron 7.5 mg at bedtime.   DIET: Regular diet.   CONSULTATIONS:  1. Lurline DelShaukat Iftikhar, MD - Gastroenterology. 2. Tiney Rougealph Ely, MD - Surgery. 3. Benita Gutterobert Gittin, MD - Oncology. 4. Physical Therapy. 5. Kristine LineaJolanta Pucilowska, MD - Psychiatry.  HOSPITAL COURSE:  1. Septic shock: The patient is a 81 year old female patient who came in because of abdominal pain, poor p.o. intake with dehydration and hypotension with blood pressure of 65/38. The patient has a history of obstructive jaundice and placement of stent in the CBD by Dr. Niel HummerIftikhar. The patient was supposed to follow up with him, but she did not have followup. The patient also has history of CA-19-9 elevation. The patient was admitted to the hospitalist service in the ICU for septic shock. The patient was found to have a SGPT of 36, SGOT 183, and total bilirubin of 8.7. Lactic acid was 5.3 CT of the abdomen showed dilated common bile duct with dilated intrahepatic ducts. The patient was found to have choledocholithiasis. She was admitted to the Intensive Care Unit for obstructive jaundice and also possible cholangitis. She was started on IV fluids, broad-spectrum antibiotics, and her blood pressure medications were held. She was also on pressors, Levophed initially for septic shock. The patient's WBC was  elevated to 12.3 with a hemoglobin of 12.7, hematocrit 37.9, and platelets 158 on admission. The patient did show improvement in her symptoms. Her white count actually improved to 7.3. Also her liver functions showed total bilaterally of 4.5 on 01/17/2011 and ALT of 70 and AST of 55. The patient was taken to ERCP again, on 01/19/2011, by Dr. Niel HummerIftikhar, which showed migrated stent in the CBD, so they replaced a stent at the CBD and also found to have a filling defect near the right and left hepatic duct confluence. The brushings were sent from there and the cytology from the brushings is negative. However, because of CA-19-9 elevation, the patient was seen by Dr. Lorre NickGittin. The patient's CA-19-9 is 8,615 and CEA is normal, so the patient was seen by Dr. Lorre NickGittin who recommended endoscopic ultrasound. The patient still has not decided about that, however, she said she does not want any chemotherapy or radiation. Dr. Orlie DakinFinnegan also saw the patient. The patient is still thinking about it. Anyway, because of deconditioning, she was seen by a physical therapist. They recommended rehab. The patient has a bed offer at [facility]. She will be going there. After two weeks she will follow-up with Dr. Lorre NickGittin and also Dr. Lurline DelShaukat Iftikhar. The patient's most recent labs, after the stent, show normalization of liver functions showing ALT of 30 and bilirubin of 3 with AST of 20; that was on 01/20/2011. The patient liver functions on 01/21/2011 showed an ALT of 21 and alkaline phosphatase of 72, and total bilirubin of 2.1, which showed great improvement from when she came  in. Also the patient's albumin was 1.8 mL. She was seen by a nutritionist who recommended Ensure. The patient was seen by Dr. Michela Pitcher, a surgeon, who suggested that the patient has acute cholangitis with previous history of stent placed and now has a filling defect. There is concern about previous instrumentation rather than acute cholecystitis. He did not recommend any  surgery. The patient was started on a clear liquid diet which she is tolerating and advance her diet. Her p.o. intake continues to be somewhat low, but she is tolerating the Ensure and she also is getting better with p.o. slowly.  2. History of suicidal ideation: The patient was saying to the family that she wants to die on 01/24/2011 and she was seen by Dr. Jennet Maduro. At that time she was placed on one-to-one suicidal precautions. The patient did not have any further intentions and did not feel she is suicidal. She was very cooperative. The patient is started on Remeron 15 mg for depression and suicidal precautions were discontinued. She is not in danger. The patient got Remeron 15 mg and she was very sleepy, so I decreased the dose to 7.5 mg. She is doing better on 7.5 mg and we are discontinuing the Haldol and Aricept. She will go to the nursing home with 7.5 mg of Remeron, same as discussed with the patient's family.  3. Hypertension: Initially she was hypotensive and in septic shock needing pressors. Right now the blood pressure is 150/80 with heart rte 108, so I restarted metoprolol, same dose, and she can continue that.   LABS/STUDIES: CA-19-9 is 8,615.  Abdominal CT scan on 01/15/2011 showed intrahepatic biliary ductal dilatation seen to the level of the pancreatic head.   TSH 1.08. Magnesium 2.3. Initial LFTs showed an AST of 68 and protein 5.2. alkaline phosphatase was 308, ALT 70, and total bilirubin 21.6.  CEA level 1.3. CA-19-9 on 01/25/2011 was 8,615.   ERCP: Her liver functions, most recent one, after the CBD stent, showed an ALT of 21, AST 16, total alkaline phosphatase 72, and bilirubin 2.1. BUN and creatinine were 9 and 0.55. Potassium was 3.1, which is being replaced.   She also had some diarrhea; stool cultures were negative.   WBC on 01/21/2011 was 9, hemoglobin 10.7, hematocrit 31.8, and platelets 194.  DISCHARGE INSTRUCTIONS: The patient is to followup with Dr. Lorre Nick in 1 to  2 weeks and with Dr. Lurline Del in 1 to 2 weeks.  CODE STATUS: DO NOT RESUSCITATE.  TIME SPENT ON DISCHARGE PREPARATION: More than 30 minutes.  ____________________________ Katha Hamming, MD sk:slb D: 01/27/2011 12:18:25 ET     T: 01/27/2011 12:55:34 ET       JOB#: [id number] cc: Knute Neu. Lorre Nick, MD Lurline Del, MD Katha Hamming MD ELECTRONICALLY SIGNED 01/31/2011 21:59

## 2014-11-26 DEATH — deceased
